# Patient Record
Sex: Male | Born: 1945 | Race: White | Hispanic: No | Marital: Single | Smoking: Current some day smoker
Health system: Southern US, Community
[De-identification: ages and names within clinical notes are randomized; demographics above are authoritative.]

## PROBLEM LIST (undated history)

## (undated) DIAGNOSIS — Z9889 Other specified postprocedural states: Secondary | ICD-10-CM

## (undated) DIAGNOSIS — E7800 Pure hypercholesterolemia, unspecified: Secondary | ICD-10-CM

## (undated) DIAGNOSIS — C801 Malignant (primary) neoplasm, unspecified: Secondary | ICD-10-CM

## (undated) DIAGNOSIS — M21371 Foot drop, right foot: Secondary | ICD-10-CM

## (undated) DIAGNOSIS — Z87442 Personal history of urinary calculi: Secondary | ICD-10-CM

## (undated) DIAGNOSIS — R112 Nausea with vomiting, unspecified: Secondary | ICD-10-CM

## (undated) DIAGNOSIS — G4730 Sleep apnea, unspecified: Secondary | ICD-10-CM

## (undated) DIAGNOSIS — M1990 Unspecified osteoarthritis, unspecified site: Secondary | ICD-10-CM

## (undated) HISTORY — PX: TONSILLECTOMY: SUR1361

## (undated) HISTORY — PX: KNEE ARTHROPLASTY: SHX992

## (undated) HISTORY — PX: LITHOTRIPSY: SUR834

## (undated) HISTORY — PX: CERVICAL SPINE SURGERY: SHX589

## (undated) HISTORY — PX: HERNIA REPAIR: SHX51

## (undated) HISTORY — PX: COLONOSCOPY: SHX174

## (undated) HISTORY — PX: SPINE SURGERY: SHX786

---

## 2015-12-01 HISTORY — PX: CARDIOVASCULAR STRESS TEST: SHX262

## 2018-10-13 ENCOUNTER — Other Ambulatory Visit (HOSPITAL_COMMUNITY): Payer: Self-pay | Admitting: Orthopedic Surgery

## 2018-10-13 DIAGNOSIS — Z96652 Presence of left artificial knee joint: Secondary | ICD-10-CM

## 2018-10-19 ENCOUNTER — Encounter (HOSPITAL_COMMUNITY)
Admission: RE | Admit: 2018-10-19 | Discharge: 2018-10-19 | Disposition: A | Discharge status: Home / Self Care | Payer: Medicare Other | Source: Ambulatory Visit | Attending: Orthopedic Surgery | Admitting: Orthopedic Surgery

## 2018-10-19 ENCOUNTER — Encounter (HOSPITAL_COMMUNITY): Payer: Self-pay

## 2018-10-19 DIAGNOSIS — Z96652 Presence of left artificial knee joint: Secondary | ICD-10-CM | POA: Insufficient documentation

## 2018-10-19 MED ORDER — TECHNETIUM TC 99M MEDRONATE IV KIT
20.0000 | PACK | Freq: Once | INTRAVENOUS | Status: AC | PRN
  Administered 2018-10-19: 20 via INTRAVENOUS

## 2018-12-26 NOTE — H&P (View-Only) (Signed)
TOTAL KNEE REVISION ADMISSION H&P  Patient is being admitted for left revision total knee arthroplasty.  Subjective:  Chief Complaint:  Left knee pain and stiffness s/p TKA  HPI: [name], 73 y.o. 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 The indications for the revision of the total knee arthroplasty are stiffness with pain. Onset of symptoms was gradual starting 1+ years ago with gradually worsening course since that time.  Prior procedures on the left knee include arthroplasty.  Patient currently rates pain in the left knee(s) at 5 out of 10 which is primarily after long periods of rest. There is night pain, pain that interferes with activities of daily living, pain with passive range of motion and joint swelling.  Patient has evidence of previous left TKA by imaging studies. This condition presents safety issues increasing the risk of falls.  There is no current active infection.  Risks, benefits and expectations were discussed with the patient.  Risks including but not limited to the risk of anesthesia, blood clots, nerve damage, blood vessel damage, failure of the prosthesis, infection and up to and including death.  Patient understand the risks, benefits and expectations and wishes to proceed with surgery.   PCP: Derrill Center., MD  D/C Plans:       Home   Post-op Meds:       No Rx given  Tranexamic Acid:      To be given - IV   Decadron:      Is to be given  FYI:      ASA  Norco  DME:    Rx given for - RW & 3-n-1  PT:    OPPT Rx given    Past Medical History:  Diagnosis Date  . Acquired right foot drop    numbness   . Allergies   . Arthritis   . Cancer ([place])    skin   . Chest pain    last occurred  a week ago; reports was at night, when pain occurs can last for hours, not accompanied by any other cards sx but does endorse new left jaw pain , saw dentist and was told it may carotid after reviewing dental/jaw images ; has had negative stress test in the past after having an abnormal EKG and dysrythmia  . Elevated cholesterol   . History of kidney stones   . Sleep apnea    i have a device but i dont really use it becasue i sleep okay without it   . ST elevation    on previous ekg      No current facility-administered medications for this encounter.    Current Outpatient Medications  Medication Sig Dispense Refill Last Dose  . acyclovir (ZOVIRAX) 400 MG tablet Take 400 mg by mouth 2 (two) times daily.     Marland Kitchen aspirin EC 81 MG tablet Take 81 mg by mouth daily.     Marland Kitchen loratadine (CLARITIN) 10 MG tablet Take 10 mg by mouth daily.     . Multiple Vitamins-Minerals (MULTIVITAMIN PO) Take 1 tablet by mouth daily.     . rosuvastatin (CRESTOR) 20 MG tablet Take 20 mg by mouth daily.     . sildenafil (REVATIO) 20 MG tablet Take 20 mg by mouth daily as needed (  for ED).      Allergies  Allergen Reactions  . Corticosteroids Other (See Comments)    Unknown  . Prednisone Other (See Comments)    Hiccups      Social History   Tobacco Use  . Smoking status: Current Some Day Smoker    Years: 6.00    Types: Cigarettes, Cigars  . Smokeless tobacco: Never Used  Substance Use Topics  . Alcohol use: Yes    Comment: 12-20 per week        Review of Systems  Constitutional: Negative.   HENT: Negative.   Eyes: Positive for blurred vision.  Respiratory: Negative.   Cardiovascular: Negative.        Irregular HR  Gastrointestinal: Negative.   Genitourinary: Negative.   Musculoskeletal: Positive for joint pain.  Skin: Negative.   Neurological: Positive for sensory change (numbness and drop foot in right LE).  Endo/Heme/Allergies: Negative.   Psychiatric/Behavioral: Negative.       Objective:  Physical Exam  Constitutional: He is oriented to person, place, and time. He appears well-developed.  HENT:  Head: Normocephalic.  Eyes: Pupils are equal, round, and reactive to light.  Neck: Neck supple. No JVD present. No tracheal deviation present. No thyromegaly present.  Cardiovascular: Normal rate, regular rhythm and intact distal pulses.  Respiratory: Effort normal and breath sounds normal. No respiratory distress. He has no wheezes.  GI: Soft. There is no abdominal tenderness. There is no guarding.  Musculoskeletal:     Left knee: He exhibits decreased range of motion and swelling. He exhibits no ecchymosis, no deformity, no laceration (healed previous incision) and no erythema. Tenderness found.  Lymphadenopathy:    He has no cervical adenopathy.  Neurological: He is alert and oriented to person, place, and time. A sensory deficit (right foot drop foot) is present.  Skin: Skin is warm and dry.  Psychiatric: He has a normal mood and affect.      Imaging Review Plain radiographs demonstrate severe degenerative joint disease of the left knee(s). The overall alignment is neutral. The bone quality appears to be good for age and reported activity level.    Preoperative templating of the joint replacement has been completed, documented, and submitted to the Operating Room personnel in order to optimize intra-operative equipment management.   Assessment/Plan:  Left knee with failed previous arthroplasty.   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 Revision total knee arthroplasty is deemed medically necessary. The treatment options including medical management, injection therapy, arthroscopy and revision arthroplasty were discussed at length. The risks and benefits of revision total knee arthroplasty were  presented and reviewed. The risks due to aseptic loosening, infection, stiffness, patella tracking problems, thromboembolic complications and other imponderables were discussed. The patient acknowledged the explanation, agreed to proceed with the plan and consent was signed. Patient is being admitted for inpatient treatment for surgery, pain control, PT, OT, prophylactic antibiotics, VTE prophylaxis, progressive ambulation and ADL's and discharge planning.The patient is planning to be discharged home.     West Pugh Brehanna Deveny   PA-C  01/10/2019, 8:07 AM

## 2018-12-26 NOTE — H&P (Signed)
TOTAL KNEE REVISION ADMISSION H&P  Patient is being admitted for left revision total knee arthroplasty.  Subjective:  Chief Complaint:  Left knee pain and stiffness s/p TKA  HPI: [name], 73 y.o. 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 The indications for the revision of the total knee arthroplasty are stiffness with pain. Onset of symptoms was gradual starting 1+ years ago with gradually worsening course since that time.  Prior procedures on the left knee include arthroplasty.  Patient currently rates pain in the left knee(s) at 5 out of 10 which is primarily after long periods of rest. There is night pain, pain that interferes with activities of daily living, pain with passive range of motion and joint swelling.  Patient has evidence of previous left TKA by imaging studies. This condition presents safety issues increasing the risk of falls.  There is no current active infection.  Risks, benefits and expectations were discussed with the patient.  Risks including but not limited to the risk of anesthesia, blood clots, nerve damage, blood vessel damage, failure of the prosthesis, infection and up to and including death.  Patient understand the risks, benefits and expectations and wishes to proceed with surgery.   PCP: Derrill Center., MD  D/C Plans:       Home   Post-op Meds:       No Rx given  Tranexamic Acid:      To be given - IV   Decadron:      Is to be given  FYI:      ASA  Norco  DME:    Rx given for - RW & 3-n-1  PT:    OPPT Rx given    Past Medical History:  Diagnosis Date  . Acquired right foot drop    numbness   . Allergies   . Arthritis   . Cancer ([place])    skin   . Chest pain    last occurred  a week ago; reports was at night, when pain occurs can last for hours, not accompanied by any other cards sx but does endorse new left jaw pain , saw dentist and was told it may carotid after reviewing dental/jaw images ; has had negative stress test in the past after having an abnormal EKG and dysrythmia  . Elevated cholesterol   . History of kidney stones   . Sleep apnea    i have a device but i dont really use it becasue i sleep okay without it   . ST elevation    on previous ekg      No current facility-administered medications for this encounter.    Current Outpatient Medications  Medication Sig Dispense Refill Last Dose  . acyclovir (ZOVIRAX) 400 MG tablet Take 400 mg by mouth 2 (two) times daily.     Marland Kitchen aspirin EC 81 MG tablet Take 81 mg by mouth daily.     Marland Kitchen loratadine (CLARITIN) 10 MG tablet Take 10 mg by mouth daily.     . Multiple Vitamins-Minerals (MULTIVITAMIN PO) Take 1 tablet by mouth daily.     . rosuvastatin (CRESTOR) 20 MG tablet Take 20 mg by mouth daily.     . sildenafil (REVATIO) 20 MG tablet Take 20 mg by mouth daily as needed (  for ED).      Allergies  Allergen Reactions  . Corticosteroids Other (See Comments)    Unknown  . Prednisone Other (See Comments)    Hiccups      Social History   Tobacco Use  . Smoking status: Current Some Day Smoker    Years: 6.00    Types: Cigarettes, Cigars  . Smokeless tobacco: Never Used  Substance Use Topics  . Alcohol use: Yes    Comment: 12-20 per week        Review of Systems  Constitutional: Negative.   HENT: Negative.   Eyes: Positive for blurred vision.  Respiratory: Negative.   Cardiovascular: Negative.        Irregular HR  Gastrointestinal: Negative.   Genitourinary: Negative.   Musculoskeletal: Positive for joint pain.  Skin: Negative.   Neurological: Positive for sensory change (numbness and drop foot in right LE).  Endo/Heme/Allergies: Negative.   Psychiatric/Behavioral: Negative.       Objective:  Physical Exam  Constitutional: He is oriented to person, place, and time. He appears well-developed.  HENT:  Head: Normocephalic.  Eyes: Pupils are equal, round, and reactive to light.  Neck: Neck supple. No JVD present. No tracheal deviation present. No thyromegaly present.  Cardiovascular: Normal rate, regular rhythm and intact distal pulses.  Respiratory: Effort normal and breath sounds normal. No respiratory distress. He has no wheezes.  GI: Soft. There is no abdominal tenderness. There is no guarding.  Musculoskeletal:     Left knee: He exhibits decreased range of motion and swelling. He exhibits no ecchymosis, no deformity, no laceration (healed previous incision) and no erythema. Tenderness found.  Lymphadenopathy:    He has no cervical adenopathy.  Neurological: He is alert and oriented to person, place, and time. A sensory deficit (right foot drop foot) is present.  Skin: Skin is warm and dry.  Psychiatric: He has a normal mood and affect.      Imaging Review Plain radiographs demonstrate severe degenerative joint disease of the left knee(s). The overall alignment is neutral. The bone quality appears to be good for age and reported activity level.    Preoperative templating of the joint replacement has been completed, documented, and submitted to the Operating Room personnel in order to optimize intra-operative equipment management.   Assessment/Plan:  Left knee with failed previous arthroplasty.   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 Revision total knee arthroplasty is deemed medically necessary. The treatment options including medical management, injection therapy, arthroscopy and revision arthroplasty were discussed at length. The risks and benefits of revision total knee arthroplasty were  presented and reviewed. The risks due to aseptic loosening, infection, stiffness, patella tracking problems, thromboembolic complications and other imponderables were discussed. The patient acknowledged the explanation, agreed to proceed with the plan and consent was signed. Patient is being admitted for inpatient treatment for surgery, pain control, PT, OT, prophylactic antibiotics, VTE prophylaxis, progressive ambulation and ADL's and discharge planning.The patient is planning to be discharged home.     West Pugh Deigo Alonso   PA-C  01/10/2019, 8:07 AM

## 2019-01-03 NOTE — Patient Instructions (Signed)
[name]  01/03/2019   Your procedure is scheduled on: 01-12-2019   Report to [facility] Main  Entrance     Report to admitting at 5:30AM    Call this number if you have problems the morning of surgery [phone]      Remember: Do not eat food or drink liquids :After Midnight. BRUSH YOUR TEETH MORNING OF SURGERY AND RINSE YOUR MOUTH OUT, NO CHEWING GUM CANDY OR MINTS.     Take these medicines the morning of surgery with A SIP OF WATER: ACYCLOVIR, CLARITIN, ROSUVASTATIN                                You may not have any metal on your body including hair pins and              piercings  Do not wear jewelry, make-up, lotions, powders or perfumes, deodorant                        Men may shave face and neck.   Do not bring valuables to the hospital. [place].  Contacts, dentures or bridgework may not be worn into surgery.  Leave suitcase in the car. After surgery it may be brought to your room.                   Please read over the following fact sheets you were given: _____________________________________________________________________             [facility] - Preparing for Surgery Before surgery, you can play an important role.  Because skin is not sterile, your skin needs to be as free of germs as possible.  You can reduce the number of germs on your skin by washing with CHG (chlorahexidine gluconate) soap before surgery.  CHG is an antiseptic cleaner which kills germs and bonds with the skin to continue killing germs even after washing. Please DO NOT use if you have an allergy to CHG or antibacterial soaps.  If your skin becomes reddened/irritated stop using the CHG and inform your nurse when you arrive at Short Stay. Do not shave (including legs and underarms) for at least 48 hours prior to the first CHG shower.  You may shave your face/neck. Please follow these instructions  carefully:  1.  Shower with CHG Soap the night before surgery and the  morning of Surgery.  2.  If you choose to wash your hair, wash your hair first as usual with your  normal  shampoo.  3.  After you shampoo, rinse your hair and body thoroughly to remove the  shampoo.                           4.  Use CHG as you would any other liquid soap.  You can apply chg directly  to the skin and wash                       Gently with a scrungie or clean washcloth.  5.  Apply the CHG Soap to your body ONLY FROM THE NECK DOWN.   Do not use on face/ open  Wound or open sores. Avoid contact with eyes, ears mouth and genitals (private parts).                       Wash face,  Genitals (private parts) with your normal soap.             6.  Wash thoroughly, paying special attention to the area where your surgery  will be performed.  7.  Thoroughly rinse your body with warm water from the neck down.  8.  DO NOT shower/wash with your normal soap after using and rinsing off  the CHG Soap.                9.  Pat yourself dry with a clean towel.            10.  Wear clean pajamas.            11.  Place clean sheets on your bed the night of your first shower and do not  sleep with pets. Day of Surgery : Do not apply any lotions/deodorants the morning of surgery.  Please wear clean clothes to the hospital/surgery center.  FAILURE TO FOLLOW THESE INSTRUCTIONS MAY RESULT IN THE CANCELLATION OF YOUR SURGERY PATIENT SIGNATURE_________________________________  NURSE SIGNATURE__________________________________  ________________________________________________________________________   [name]  An incentive spirometer is a tool that can help keep your lungs clear and active. This tool measures how well you are filling your lungs with each breath. Taking long deep breaths may help reverse or decrease the chance of developing breathing (pulmonary) problems (especially infection)  following:  A long period of time when you are unable to move or be active. BEFORE THE PROCEDURE   If the spirometer includes an indicator to show your best effort, your nurse or respiratory therapist will set it to a desired goal.  If possible, sit up straight or lean slightly forward. Try not to slouch.  Hold the incentive spirometer in an upright position. INSTRUCTIONS FOR USE  1. Sit on the edge of your bed if possible, or sit up as far as you can in bed or on a chair. 2. Hold the incentive spirometer in an upright position. 3. Breathe out normally. 4. Place the mouthpiece in your mouth and seal your lips tightly around it. 5. Breathe in slowly and as deeply as possible, raising the piston or the ball toward the top of the column. 6. Hold your breath for 3-5 seconds or for as long as possible. Allow the piston or ball to fall to the bottom of the column. 7. Remove the mouthpiece from your mouth and breathe out normally. 8. Rest for a few seconds and repeat Steps 1 through 7 at least 10 times every 1-2 hours when you are awake. Take your time and take a few normal breaths between deep breaths. 9. The spirometer may include an indicator to show your best effort. Use the indicator as a goal to work toward during each repetition. 10. After each set of 10 deep breaths, practice coughing to be sure your lungs are clear. If you have an incision (the cut made at the time of surgery), support your incision when coughing by placing a pillow or rolled up towels firmly against it. Once you are able to get out of bed, walk around indoors and cough well. You may stop using the incentive spirometer when instructed by your caregiver.  RISKS AND COMPLICATIONS  Take your time so you do not get  dizzy or light-headed.  If you are in pain, you may need to take or ask for pain medication before doing incentive spirometry. It is harder to take a deep breath if you are having pain. AFTER USE  Rest and  breathe slowly and easily.  It can be helpful to keep track of a log of your progress. Your caregiver can provide you with a simple table to help with this. If you are using the spirometer at home, follow these instructions: [place] IF:   You are having difficultly using the spirometer.  You have trouble using the spirometer as often as instructed.  Your pain medication is not giving enough relief while using the spirometer.  You develop fever of 100.5 F (38.1 C) or higher. SEEK IMMEDIATE MEDICAL CARE IF:   You cough up bloody sputum that had not been present before.  You develop fever of 102 F (38.9 C) or greater.  You develop worsening pain at or near the incision site. MAKE SURE YOU:   Understand these instructions.  Will watch your condition.  Will get help right away if you are not doing well or get worse. Document Released: 03/29/2007 Document Revised: 02/08/2012 Document Reviewed: 05/30/2007 Exit[employer] Patient Information 2014 Exit[employer], [place].   ________________________________________________________________________  WHAT IS A BLOOD TRANSFUSION? Blood Transfusion Information  A transfusion is the replacement of blood or some of its parts. Blood is made up of multiple cells which provide different functions.  Red blood cells carry oxygen and are used for blood loss replacement.  White blood cells fight against infection.  Platelets control bleeding.  Plasma helps clot blood.  Other blood products are available for specialized needs, such as hemophilia or other clotting disorders. BEFORE THE TRANSFUSION  Who gives blood for transfusions?   Healthy volunteers who are fully evaluated to make sure their blood is safe. This is blood bank blood. Transfusion therapy is the safest it has ever been in the practice of medicine. Before blood is taken from a donor, a complete history is taken to make sure that person has no history of diseases nor engages in  risky social behavior (examples are intravenous drug use or sexual activity with multiple partners). The donor's travel history is screened to minimize risk of transmitting infections, such as malaria. The donated blood is tested for signs of infectious diseases, such as HIV and hepatitis. The blood is then tested to be sure it is compatible with you in order to minimize the chance of a transfusion reaction. If you or a relative donates blood, this is often done in anticipation of surgery and is not appropriate for emergency situations. It takes many days to process the donated blood. RISKS AND COMPLICATIONS Although transfusion therapy is very safe and saves many lives, the main dangers of transfusion include:   Getting an infectious disease.  Developing a transfusion reaction. This is an allergic reaction to something in the blood you were given. Every precaution is taken to prevent this. The decision to have a blood transfusion has been considered carefully by your caregiver before blood is given. Blood is not given unless the benefits outweigh the risks. AFTER THE TRANSFUSION  Right after receiving a blood transfusion, you will usually feel much better and more energetic. This is especially true if your red blood cells have gotten low (anemic). The transfusion raises the level of the red blood cells which carry oxygen, and this usually causes an energy increase.  The nurse administering the transfusion will  monitor you carefully for complications. HOME CARE INSTRUCTIONS  No special instructions are needed after a transfusion. You may find your energy is better. Speak with your caregiver about any limitations on activity for underlying diseases you may have. SEEK MEDICAL CARE IF:   Your condition is not improving after your transfusion.  You develop redness or irritation at the intravenous (IV) site. SEEK IMMEDIATE MEDICAL CARE IF:  Any of the following symptoms occur over the next 12  hours:  Shaking chills.  You have a temperature by mouth above 102 F (38.9 C), not controlled by medicine.  Chest, back, or muscle pain.  People around you feel you are not acting correctly or are confused.  Shortness of breath or difficulty breathing.  Dizziness and fainting.  You get a rash or develop hives.  You have a decrease in urine output.  Your urine turns a dark color or changes to pink, red, or brown. Any of the following symptoms occur over the next 10 days:  You have a temperature by mouth above 102 F (38.9 C), not controlled by medicine.  Shortness of breath.  Weakness after normal activity.  The white part of the eye turns yellow (jaundice).  You have a decrease in the amount of urine or are urinating less often.  Your urine turns a dark color or changes to pink, red, or brown. Document Released: 11/13/2000 Document Revised: 02/08/2012 Document Reviewed: 07/02/2008 [facility] Patient Information 2014 [place], [place].  _______________________________________________________________________

## 2019-01-03 NOTE — Progress Notes (Signed)
SURGICAL CLEARANCE . DR Jenny Reichmann WEAVER ON CHART  STRESS/ ECHO 2017 Epic CARE EVERYWHERE

## 2019-01-05 ENCOUNTER — Other Ambulatory Visit: Payer: Self-pay

## 2019-01-05 ENCOUNTER — Encounter (HOSPITAL_COMMUNITY): Payer: Self-pay | Admitting: Physician Assistant

## 2019-01-05 ENCOUNTER — Encounter (HOSPITAL_COMMUNITY): Payer: Self-pay

## 2019-01-05 ENCOUNTER — Encounter (HOSPITAL_COMMUNITY)
Admission: RE | Admit: 2019-01-05 | Discharge: 2019-01-05 | Disposition: A | Discharge status: Home / Self Care | Payer: Medicare Other | Source: Ambulatory Visit | Attending: Orthopedic Surgery | Admitting: Orthopedic Surgery

## 2019-01-05 DIAGNOSIS — Z01818 Encounter for other preprocedural examination: Secondary | ICD-10-CM | POA: Diagnosis not present

## 2019-01-05 DIAGNOSIS — I213 ST elevation (STEMI) myocardial infarction of unspecified site: Secondary | ICD-10-CM | POA: Insufficient documentation

## 2019-01-05 HISTORY — DX: Unspecified osteoarthritis, unspecified site: M19.90

## 2019-01-05 HISTORY — DX: Pure hypercholesterolemia, unspecified: E78.00

## 2019-01-05 HISTORY — DX: Malignant (primary) neoplasm, unspecified: C80.1

## 2019-01-05 HISTORY — DX: Personal history of urinary calculi: Z87.442

## 2019-01-05 HISTORY — DX: Foot drop, right foot: M21.371

## 2019-01-05 HISTORY — DX: Sleep apnea, unspecified: G47.30

## 2019-01-05 LAB — BASIC METABOLIC PANEL
Anion gap: 8 (ref 5–15)
BUN: 15 mg/dL (ref 8–23)
CO2: 25 mmol/L (ref 22–32)
Calcium: 8.7 mg/dL — ABNORMAL LOW (ref 8.9–10.3)
Chloride: 105 mmol/L (ref 98–111)
Creatinine, Ser: 0.92 mg/dL (ref 0.61–1.24)
GFR calc Af Amer: >60 mL/min (ref >60)
GFR calc non Af Amer: >60 mL/min (ref >60)
Glucose, Bld: 94 mg/dL (ref 70–99)
Potassium: 4.1 mmol/L (ref 3.5–5.1)
Sodium: 138 mmol/L (ref 135–145)

## 2019-01-05 LAB — CBC
HCT: 45.1 % (ref 39.0–52.0)
Hemoglobin: 14.8 g/dL (ref 13.0–17.0)
MCH: 30.6 pg (ref 26.0–34.0)
MCHC: 32.8 g/dL (ref 30.0–36.0)
MCV: 93.2 fL (ref 80.0–100.0)
NRBC: 0 % (ref 0.0–0.2)
Platelets: 181 10*3/uL (ref 150–400)
RBC: 4.84 MIL/uL (ref 4.22–5.81)
RDW: 12 % (ref 11.5–15.5)
WBC: 4.3 10*3/uL (ref 4.0–10.5)

## 2019-01-05 LAB — SURGICAL PCR SCREEN
MRSA, PCR: NEGATIVE
STAPHYLOCOCCUS AUREUS: NEGATIVE

## 2019-01-05 LAB — ABO/RH: ABO/RH(D): A POS

## 2019-01-05 NOTE — Progress Notes (Signed)
Patient reports intermittent chest pain and jaw pain. RN made [place] PA aware. EKG obtained at pre-op, per Janett Billow , surgeons office has been contacted to begin process of obtaining cardiac clearance .

## 2019-01-06 ENCOUNTER — Inpatient Hospital Stay (HOSPITAL_COMMUNITY): Admission: RE | Admit: 2019-01-06 | Payer: Medicare Other | Source: Ambulatory Visit

## 2019-01-10 NOTE — Progress Notes (Signed)
Cardiology Office Note   Date:  01/11/2019   ID:  [name], DOB [birth year], MRN [id number]  PCP:  Derrill Center., MD  Cardiologist:   No primary care provider on file. Referring:  [name], MD  Chief Complaint  Patient presents with  . Chest Pain      History of Present Illness: [name] is a 73 y.o. male who is referred by [name], MD for preop evaluation.   He has had chest pain.  He also has an abnormal EKG.  He did have a stress perfusion study at [facility] in the past.  I was able to review this result and this was negative for any evidence of ischemia but a well-preserved ejection fraction in 2017.  He is getting ready to have a redo left knee surgery.  Has had surgery on this a couple of times but has to have the replacement replacement.  During the preop evaluation he described some chest discomfort and some neck discomfort.  Chest discomfort was last week.  Happen at rest.  He was admitted sharp discomfort lasting for about an hour.  It was 7 out of 10 in intensity.  It did not radiate.  There was no jaw or arm pain at that moment.  There was no associated symptoms.  He had no nausea vomiting or diaphoresis.  He also had some jaw pain.  This happened about 3 to 4 weeks ago and again in December.  It was up his posterior left neck.  It was tight discomfort.  There again was no associated nausea vomiting or diaphoresis.  He has not had any shortness of breath, PND or orthopnea.  He has some palpitations but has had these for years.  He is never had any presyncope or syncope.  He exercises almost every day doing some aggressive aerobic activities.  Of note he does mention scanning in the past demonstrating aortic atherosclerosis told that he had some carotid plaque over is never had Doppler to quantify this.   Past Medical History:  Diagnosis Date  . Acquired right foot drop    numbness   . Arthritis   . Cancer ([place])    skin   . Elevated cholesterol   .  History of kidney stones   . Sleep apnea    Does not use CPAP   PSH: Tonsillectomy Cervical spine surgery Lumbar spine surgery Lithotripsy Inguinal hernia repair Total knee replacement   Current Outpatient Medications  Medication Sig Dispense Refill  . acyclovir (ZOVIRAX) 400 MG tablet Take 400 mg by mouth 2 (two) times daily.    Marland Kitchen loratadine (CLARITIN) 10 MG tablet Take 10 mg by mouth daily.    . Multiple Vitamins-Minerals (MULTIVITAMIN PO) Take 1 tablet by mouth daily.    . rosuvastatin (CRESTOR) 20 MG tablet Take 20 mg by mouth daily.    . sildenafil (REVATIO) 20 MG tablet Take 20 mg by mouth daily as needed (for ED).    Marland Kitchen aspirin EC 81 MG tablet Take 81 mg by mouth daily.     No current facility-administered medications for this visit.     Allergies:   Corticosteroids and Prednisone    Social History:  The patient  reports that he has been smoking cigars. He has smoked for the past 6.00 years. He has never used smokeless tobacco. He reports current alcohol use.   Family History:  The patient's family history includes Alkaptonuria in his sister; Barb Merino in his sister; Dementia in  his father; Heart attack in his paternal grandfather; Parkinson's disease in his father.    ROS:  Please see the history of present illness.   Otherwise, review of systems are positive for none.   All other systems are reviewed and negative.    PHYSICAL EXAM: VS:  BP 124/70   Pulse 80   Ht 5\' 10"  (1.778 m)   Wt 183 lb (83 kg)   BMI 26.26 kg/m  , BMI Body mass index is 26.26 kg/m. GENERAL:  Well appearing HEENT:  Pupils equal round and reactive, fundi not visualized, oral mucosa unremarkable NECK:  No jugular venous distention, waveform within normal limits, carotid upstroke brisk and symmetric, no bruits, no thyromegaly LYMPHATICS:  No cervical, inguinal adenopathy LUNGS:  Clear to auscultation bilaterally BACK:  No CVA tenderness CHEST:  Unremarkable HEART:  PMI not displaced or  sustained,S1 and S2 within normal limits, no S3, no S4, no clicks, no rubs, no murmurs ABD:  Flat, positive bowel sounds normal in frequency in pitch, no bruits, no rebound, no guarding, no midline pulsatile mass, no hepatomegaly, no splenomegaly EXT:  2 plus pulses throughout, no edema, no cyanosis no clubbing SKIN:  No rashes no nodules NEURO:  Cranial nerves II through XII grossly intact, motor grossly intact throughout PSYCH:  Cognitively intact, oriented to person place and time    EKG:  EKG is not ordered today. The ekg ordered 01/05/19 demonstrates sinus rhythm, rate 77, axis within normal limits, intervals within normal limits, no acute ST-T wave changes, premature ectopic complexes.   Recent Labs: 01/05/2019: BUN 15; Creatinine, Ser 0.92; Hemoglobin 14.8; Platelets 181; Potassium 4.1; Sodium 138    Lipid Panel No results found for: CHOL, TRIG, HDL, CHOLHDL, VLDL, LDLCALC, LDLDIRECT    Wt Readings from Last 3 Encounters:  01/11/19 183 lb (83 kg)  01/05/19 179 lb (81.2 kg)      Other studies Reviewed: Additional studies/ records that were reviewed today include: EKG, Lexiscan Myoview.. Review of the above records demonstrates:  Please see elsewhere in the note.     ASSESSMENT AND PLAN:  PREOP: The patient did have some chest discomfort.  This will be evaluated as below.  CHEST PAIN:  I am going to bring him back for a POET (Plain Old Exercise Treadmill).  If this is negative as I suspect it will be he will be acceptable risk for planned surgery.  DYSLIPIDEMIA: Given the evidence of plaque mentioned above I agree with a goal LDL of 70 or lower.  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  No change in therapy.  CAROTID PLAQUE: He has had this noted in the past and will now follow-up with carotid Dopplers.   Current medicines are reviewed at length with the patient today.  The patient does not have concerns regarding medicines.  The following  changes have been made:  None  Labs/ tests ordered today include:   Orders Placed This Encounter  Procedures  . EXERCISE TOLERANCE TEST (ETT)     Disposition:   FU with me as needed.     Signed, Minus Breeding, MD  01/11/2019 12:27 PM    [facility]

## 2019-01-11 ENCOUNTER — Encounter: Payer: Self-pay | Admitting: Cardiology

## 2019-01-11 ENCOUNTER — Telehealth (HOSPITAL_COMMUNITY): Payer: Self-pay | Admitting: *Deleted

## 2019-01-11 ENCOUNTER — Ambulatory Visit (INDEPENDENT_AMBULATORY_CARE_PROVIDER_SITE_OTHER): Payer: Medicare Other | Admitting: Cardiology

## 2019-01-11 VITALS — BP 124/70 | HR 80 | Ht 70.0 in | Wt 183.0 lb

## 2019-01-11 DIAGNOSIS — R079 Chest pain, unspecified: Secondary | ICD-10-CM | POA: Diagnosis not present

## 2019-01-11 DIAGNOSIS — I6523 Occlusion and stenosis of bilateral carotid arteries: Secondary | ICD-10-CM | POA: Insufficient documentation

## 2019-01-11 DIAGNOSIS — I700 Atherosclerosis of aorta: Secondary | ICD-10-CM

## 2019-01-11 DIAGNOSIS — E785 Hyperlipidemia, unspecified: Secondary | ICD-10-CM

## 2019-01-11 DIAGNOSIS — I493 Ventricular premature depolarization: Secondary | ICD-10-CM | POA: Diagnosis not present

## 2019-01-11 DIAGNOSIS — I6529 Occlusion and stenosis of unspecified carotid artery: Secondary | ICD-10-CM | POA: Insufficient documentation

## 2019-01-11 NOTE — Patient Instructions (Addendum)
Medication Instructions:  Continue current medications  If you need a refill on your cardiac medications before your next appointment, please call your pharmacy.  Labwork: None Ordered   Take the provided lab slips with you to the lab for your blood draw.   When you have your labs (blood work) drawn today and your tests are completely normal, you will receive your results only by MyChart Message (if you have MyChart) -OR-  A paper copy in the mail.  If you have any lab test that is abnormal or we need to change your treatment, we will call you to review these results.  Testing/Procedures: Your physician has requested that you have an exercise tolerance test. For further information please visit [email or web address]. Please also follow instruction sheet, as given.   Follow-Up: . Your physician recommends that you schedule a follow-up appointment in: As Needed   At [facility], you and your health needs are our priority.  As part of our continuing mission to provide you with exceptional heart care, we have created designated Provider Care Teams.  These Care Teams include your primary Cardiologist (physician) and Advanced Practice Providers (APPs -  Physician Assistants and Nurse Practitioners) who all work together to provide you with the care you need, when you need it.   Thank you for choosing [facility] at [facility]!!

## 2019-01-11 NOTE — Telephone Encounter (Signed)
Close encounter 

## 2019-01-12 ENCOUNTER — Ambulatory Visit (HOSPITAL_COMMUNITY)
Admission: RE | Admit: 2019-01-12 | Discharge: 2019-01-12 | Disposition: A | Discharge status: Home / Self Care | Payer: Medicare Other | Source: Ambulatory Visit | Attending: Cardiology | Admitting: Cardiology

## 2019-01-12 ENCOUNTER — Encounter (HOSPITAL_COMMUNITY): Admission: RE | Payer: Self-pay | Source: Home / Self Care

## 2019-01-12 ENCOUNTER — Inpatient Hospital Stay (HOSPITAL_COMMUNITY): Admission: RE | Admit: 2019-01-12 | Payer: Medicare Other | Source: Home / Self Care | Admitting: Orthopedic Surgery

## 2019-01-12 DIAGNOSIS — R079 Chest pain, unspecified: Secondary | ICD-10-CM | POA: Insufficient documentation

## 2019-01-12 LAB — EXERCISE TOLERANCE TEST
CSEPHR: 91 %
Estimated workload: 11.4 METS
Exercise duration (min): 9 min
Exercise duration (sec): 48 s
MPHR: 149 {beats}/min
Peak HR: 136 {beats}/min
RPE: 17
Rest HR: 76 {beats}/min

## 2019-01-12 LAB — TYPE AND SCREEN
ABO/RH(D): A POS
Antibody Screen: NEGATIVE

## 2019-01-12 SURGERY — TOTAL KNEE REVISION
Anesthesia: Spinal | Laterality: Left

## 2019-01-13 ENCOUNTER — Ambulatory Visit (HOSPITAL_COMMUNITY)
Admission: RE | Admit: 2019-01-13 | Discharge: 2019-01-13 | Disposition: A | Discharge status: Home / Self Care | Payer: Medicare Other | Source: Ambulatory Visit | Attending: Cardiology | Admitting: Cardiology

## 2019-01-13 DIAGNOSIS — I6529 Occlusion and stenosis of unspecified carotid artery: Secondary | ICD-10-CM | POA: Insufficient documentation

## 2019-01-17 ENCOUNTER — Telehealth: Payer: Self-pay | Admitting: *Deleted

## 2019-01-17 ENCOUNTER — Encounter: Payer: Self-pay | Admitting: Cardiology

## 2019-01-17 NOTE — Telephone Encounter (Signed)
Patient notified of carotid ans ETT results. Acceptable risk for surgery to be done by Dr Alvan Dame.

## 2019-01-17 NOTE — Telephone Encounter (Signed)
-----   Message from Minus Breeding, MD sent at 01/13/2019  9:25 PM EST ----- No evidence of plaque.  No further work up.  Call Mr. Crosley with the results and send results to Derrill Center., MD

## 2019-01-18 NOTE — Progress Notes (Signed)
Please place orders in Epic as patient is being scheduled for a pre-op appointment for his surgery on 01/30/2019! Thank you!

## 2019-01-23 NOTE — Patient Instructions (Addendum)
[name]  01/23/2019   Your procedure is scheduled on: 01/30/2019   Report to [facility] Main  Entrance  Report to admitting 1000 AM    Call this number if you have problems the morning of surgery [phone]   Remember: Do not eat food or drink liquids :After Midnight. BRUSH YOUR TEETH MORNING OF SURGERY AND RINSE YOUR MOUTH OUT, NO CHEWING GUM CANDY OR MINTS.     Take these medicines the morning of surgery with A SIP OF WATER: none                                 You may not have any metal on your body including hair pins and              piercings  Do not wear jewelry, , lotions, powders or perfumes, deodorant                          Men may shave face and neck.   Do not bring valuables to the hospital. [place].  Contacts, dentures or bridgework may not be worn into surgery.  Leave suitcase in the car. After surgery it may be brought to your room.                   Please read over the following fact sheets you were given: _____________________________________________________________________             [facility] - Preparing for Surgery Before surgery, you can play an important role.  Because skin is not sterile, your skin needs to be as free of germs as possible.  You can reduce the number of germs on your skin by washing with CHG (chlorahexidine gluconate) soap before surgery.  CHG is an antiseptic cleaner which kills germs and bonds with the skin to continue killing germs even after washing. Please DO NOT use if you have an allergy to CHG or antibacterial soaps.  If your skin becomes reddened/irritated stop using the CHG and inform your nurse when you arrive at Short Stay. Do not shave (including legs and underarms) for at least 48 hours prior to the first CHG shower.  You may shave your face/neck. Please follow these instructions carefully:  1.  Shower with CHG Soap the night before  surgery and the  morning of Surgery.  2.  If you choose to wash your hair, wash your hair first as usual with your  normal  shampoo.  3.  After you shampoo, rinse your hair and body thoroughly to remove the  shampoo.                           4.  Use CHG as you would any other liquid soap.  You can apply chg directly  to the skin and wash                       Gently with a scrungie or clean washcloth.  5.  Apply the CHG Soap to your body ONLY FROM THE NECK DOWN.   Do not use on face/ open  Wound or open sores. Avoid contact with eyes, ears mouth and genitals (private parts).                       Wash face,  Genitals (private parts) with your normal soap.             6.  Wash thoroughly, paying special attention to the area where your surgery  will be performed.  7.  Thoroughly rinse your body with warm water from the neck down.  8.  DO NOT shower/wash with your normal soap after using and rinsing off  the CHG Soap.                9.  Pat yourself dry with a clean towel.            10.  Wear clean pajamas.            11.  Place clean sheets on your bed the night of your first shower and do not  sleep with pets. Day of Surgery : Do not apply any lotions/deodorants the morning of surgery.  Please wear clean clothes to the hospital/surgery center.  FAILURE TO FOLLOW THESE INSTRUCTIONS MAY RESULT IN THE CANCELLATION OF YOUR SURGERY PATIENT SIGNATURE_________________________________  NURSE SIGNATURE__________________________________  ________________________________________________________________________  WHAT IS A BLOOD TRANSFUSION? Blood Transfusion Information  A transfusion is the replacement of blood or some of its parts. Blood is made up of multiple cells which provide different functions.  Red blood cells carry oxygen and are used for blood loss replacement.  White blood cells fight against infection.  Platelets control bleeding.  Plasma helps clot  blood.  Other blood products are available for specialized needs, such as hemophilia or other clotting disorders. BEFORE THE TRANSFUSION  Who gives blood for transfusions?   Healthy volunteers who are fully evaluated to make sure their blood is safe. This is blood bank blood. Transfusion therapy is the safest it has ever been in the practice of medicine. Before blood is taken from a donor, a complete history is taken to make sure that person has no history of diseases nor engages in risky social behavior (examples are intravenous drug use or sexual activity with multiple partners). The donor's travel history is screened to minimize risk of transmitting infections, such as malaria. The donated blood is tested for signs of infectious diseases, such as HIV and hepatitis. The blood is then tested to be sure it is compatible with you in order to minimize the chance of a transfusion reaction. If you or a relative donates blood, this is often done in anticipation of surgery and is not appropriate for emergency situations. It takes many days to process the donated blood. RISKS AND COMPLICATIONS Although transfusion therapy is very safe and saves many lives, the main dangers of transfusion include:   Getting an infectious disease.  Developing a transfusion reaction. This is an allergic reaction to something in the blood you were given. Every precaution is taken to prevent this. The decision to have a blood transfusion has been considered carefully by your caregiver before blood is given. Blood is not given unless the benefits outweigh the risks. AFTER THE TRANSFUSION  Right after receiving a blood transfusion, you will usually feel much better and more energetic. This is especially true if your red blood cells have gotten low (anemic). The transfusion raises the level of the red blood cells which carry oxygen, and this usually causes an energy increase.  The  nurse administering the transfusion will monitor  you carefully for complications. HOME CARE INSTRUCTIONS  No special instructions are needed after a transfusion. You may find your energy is better. Speak with your caregiver about any limitations on activity for underlying diseases you may have. SEEK MEDICAL CARE IF:   Your condition is not improving after your transfusion.  You develop redness or irritation at the intravenous (IV) site. SEEK IMMEDIATE MEDICAL CARE IF:  Any of the following symptoms occur over the next 12 hours:  Shaking chills.  You have a temperature by mouth above 102 F (38.9 C), not controlled by medicine.  Chest, back, or muscle pain.  People around you feel you are not acting correctly or are confused.  Shortness of breath or difficulty breathing.  Dizziness and fainting.  You get a rash or develop hives.  You have a decrease in urine output.  Your urine turns a dark color or changes to pink, red, or brown. Any of the following symptoms occur over the next 10 days:  You have a temperature by mouth above 102 F (38.9 C), not controlled by medicine.  Shortness of breath.  Weakness after normal activity.  The white part of the eye turns yellow (jaundice).  You have a decrease in the amount of urine or are urinating less often.  Your urine turns a dark color or changes to pink, red, or brown. Document Released: 11/13/2000 Document Revised: 02/08/2012 Document Reviewed: 07/02/2008 ExitCare Patient Information 2014 [place].  _______________________________________________________________________  Incentive Spirometer  An incentive spirometer is a tool that can help keep your lungs clear and active. This tool measures how well you are filling your lungs with each breath. Taking long deep breaths may help reverse or decrease the chance of developing breathing (pulmonary) problems (especially infection) following:  A long period of time when you are unable to move or be active. BEFORE THE  PROCEDURE   If the spirometer includes an indicator to show your best effort, your nurse or respiratory therapist will set it to a desired goal.  If possible, sit up straight or lean slightly forward. Try not to slouch.  Hold the incentive spirometer in an upright position. INSTRUCTIONS FOR USE  1. Sit on the edge of your bed if possible, or sit up as far as you can in bed or on a chair. 2. Hold the incentive spirometer in an upright position. 3. Breathe out normally. 4. Place the mouthpiece in your mouth and seal your lips tightly around it. 5. Breathe in slowly and as deeply as possible, raising the piston or the ball toward the top of the column. 6. Hold your breath for 3-5 seconds or for as long as possible. Allow the piston or ball to fall to the bottom of the column. 7. Remove the mouthpiece from your mouth and breathe out normally. 8. Rest for a few seconds and repeat Steps 1 through 7 at least 10 times every 1-2 hours when you are awake. Take your time and take a few normal breaths between deep breaths. 9. The spirometer may include an indicator to show your best effort. Use the indicator as a goal to work toward during each repetition. 10. After each set of 10 deep breaths, practice coughing to be sure your lungs are clear. If you have an incision (the cut made at the time of surgery), support your incision when coughing by placing a pillow or rolled up towels firmly against it. Once you are able to get  out of bed, walk around indoors and cough well. You may stop using the incentive spirometer when instructed by your caregiver.  RISKS AND COMPLICATIONS  Take your time so you do not get dizzy or light-headed.  If you are in pain, you may need to take or ask for pain medication before doing incentive spirometry. It is harder to take a deep breath if you are having pain. AFTER USE  Rest and breathe slowly and easily.  It can be helpful to keep track of a log of your progress. Your  caregiver can provide you with a simple table to help with this. If you are using the spirometer at home, follow these instructions: [place] IF:   You are having difficultly using the spirometer.  You have trouble using the spirometer as often as instructed.  Your pain medication is not giving enough relief while using the spirometer.  You develop fever of 100.5 F (38.1 C) or higher. SEEK IMMEDIATE MEDICAL CARE IF:   You cough up bloody sputum that had not been present before.  You develop fever of 102 F (38.9 C) or greater.  You develop worsening pain at or near the incision site. MAKE SURE YOU:   Understand these instructions.  Will watch your condition.  Will get help right away if you are not doing well or get worse. Document Released: 03/29/2007 Document Revised: 02/08/2012 Document Reviewed: 05/30/2007 [employer] Patient Information 2014 [place], [place].   ________________________________________________________________________

## 2019-01-26 ENCOUNTER — Encounter (HOSPITAL_COMMUNITY): Payer: Self-pay

## 2019-01-26 ENCOUNTER — Encounter (HOSPITAL_COMMUNITY)
Admission: RE | Admit: 2019-01-26 | Discharge: 2019-01-26 | Disposition: A | Discharge status: Home / Self Care | Payer: Medicare Other | Source: Ambulatory Visit | Attending: Orthopedic Surgery | Admitting: Orthopedic Surgery

## 2019-01-26 ENCOUNTER — Other Ambulatory Visit: Payer: Self-pay

## 2019-01-26 DIAGNOSIS — Z01812 Encounter for preprocedural laboratory examination: Secondary | ICD-10-CM | POA: Diagnosis not present

## 2019-01-26 DIAGNOSIS — T85848A Pain due to other internal prosthetic devices, implants and grafts, initial encounter: Secondary | ICD-10-CM | POA: Diagnosis not present

## 2019-01-26 DIAGNOSIS — Z96652 Presence of left artificial knee joint: Secondary | ICD-10-CM | POA: Diagnosis not present

## 2019-01-26 HISTORY — DX: Other specified postprocedural states: R11.2

## 2019-01-26 HISTORY — DX: Other specified postprocedural states: Z98.890

## 2019-01-26 LAB — CBC
HCT: 45.5 % (ref 39.0–52.0)
Hemoglobin: 15.2 g/dL (ref 13.0–17.0)
MCH: 31.5 pg (ref 26.0–34.0)
MCHC: 33.4 g/dL (ref 30.0–36.0)
MCV: 94.4 fL (ref 80.0–100.0)
PLATELETS: 186 10*3/uL (ref 150–400)
RBC: 4.82 MIL/uL (ref 4.22–5.81)
RDW: 12.2 % (ref 11.5–15.5)
WBC: 4.8 10*3/uL (ref 4.0–10.5)
nRBC: 0 % (ref 0.0–0.2)

## 2019-01-26 LAB — BASIC METABOLIC PANEL
Anion gap: 8 (ref 5–15)
BUN: 17 mg/dL (ref 8–23)
CALCIUM: 8.9 mg/dL (ref 8.9–10.3)
CO2: 25 mmol/L (ref 22–32)
CREATININE: 0.85 mg/dL (ref 0.61–1.24)
Chloride: 106 mmol/L (ref 98–111)
GFR calc Af Amer: >60 mL/min (ref >60)
GFR calc non Af Amer: >60 mL/min (ref >60)
Glucose, Bld: 98 mg/dL (ref 70–99)
Potassium: 4.3 mmol/L (ref 3.5–5.1)
Sodium: 139 mmol/L (ref 135–145)

## 2019-01-26 LAB — SURGICAL PCR SCREEN
MRSA, PCR: NEGATIVE
STAPHYLOCOCCUS AUREUS: NEGATIVE

## 2019-01-26 NOTE — Progress Notes (Addendum)
EKG-01/05/19-epic  01/17/19- Clearance- DR Hochrein- epic  Stress Test 01/12/19-epic  Carotid- 01/13/19-epic  LOV- Dr Percival Spanish 01/11/19-epic  Surgery was cancelled on 01/11/2019 due to patient reported chest pain.  Sent to Dr Starleen Blue for workup.

## 2019-01-27 NOTE — Anesthesia Preprocedure Evaluation (Addendum)
Anesthesia Evaluation  Patient identified by MRN, date of birth, ID band Patient awake    Reviewed: Allergy & Precautions, NPO status , Patient's Chart, lab work & pertinent test results  History of Anesthesia Complications (+) PONV  Airway Mallampati: II  TM Distance: >3 FB Neck ROM: Full    Dental no notable dental hx. (+) Teeth Intact   Pulmonary sleep apnea , Current Smoker,    Pulmonary exam normal breath sounds clear to auscultation       Cardiovascular Exercise Tolerance: Good negative cardio ROS Normal cardiovascular exam Rhythm:Regular Rate:Normal  01/12/2019 Neg ETT 01/05/2019 EKG- SR R 77 PVCs   Neuro/Psych negative neurological ROS  negative psych ROS   GI/Hepatic negative GI ROS, Neg liver ROS,   Endo/Other  negative endocrine ROS  Renal/GU negative Renal ROSCr 0.85     Musculoskeletal  (+) Arthritis ,   Abdominal   Peds  Hematology Hgb 15.2 Plts 186   Anesthesia Other Findings   Reproductive/Obstetrics                           Anesthesia Physical Anesthesia Plan  ASA: II  Anesthesia Plan: Spinal   Post-op Pain Management:  Regional for Post-op pain   Induction:   PONV Risk Score and Plan: Treatment may vary due to age or medical condition, Ondansetron and Dexamethasone  Airway Management Planned: Nasal Cannula and Natural Airway  Additional Equipment:   Intra-op Plan:   Post-operative Plan:   Informed Consent: I have reviewed the patients History and Physical, chart, labs and discussed the procedure including the risks, benefits and alternatives for the proposed anesthesia with the patient or authorized representative who has indicated his/her understanding and acceptance.     Dental advisory given  Plan Discussed with:   Anesthesia Plan Comments: (See PAT note 01/26/19, Konrad Felix, PA-C  L TKRevision under Spinal w Adductor Canal)       Anesthesia Quick Evaluation

## 2019-01-27 NOTE — Progress Notes (Signed)
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 [name].   Surgery previously cancelled 01/11/19 due to pt complaining of chest pain.  Seen by cardiology, Dr. Minus Breeding, on 01/11/19.  Exercise test and carotid doppler ordered with negative results.  Test results states, "Acceptable risk for surgery to be done by Dr. Alvan Dame."  Pt can proceed with planned procedure barring acute status change.  VS: BP 139/80   Pulse 73   Temp 36.6 C (Oral)   Resp 16   Ht 5\' 10"  (1.778 m)   Wt 81.3 kg   SpO2 97%   BMI 25.71 kg/m   PROVIDERS: Derrill Center., MD is PCP   Minus Breeding, MD is Cardiologist  LABS: Labs reviewed: Acceptable for surgery. (all labs ordered are listed, but only abnormal results are displayed)  Labs Reviewed  SURGICAL PCR SCREEN  BASIC METABOLIC PANEL  CBC  TYPE AND SCREEN     IMAGES: Carotid Doppler 01/13/19 Summary: Right Carotid: The extracranial vessels were near-normal with only minimal wall                thickening or plaque. Left Carotid: There was no evidence of thrombus, dissection, atherosclerotic               plaque or stenosis in the cervical carotid system. Vertebrals:  Bilateral vertebral arteries demonstrate antegrade flow. Subclavians: Normal flow hemodynamics were seen in bilateral subclavian              arteries.  EKG: 01/05/2019 Rate 77 bpm Sinus rhythm with occasional Premature ventricular complexes Otherwise normal ECG  CV: Stress Test 01/12/19  There was no ST segment deviation noted during stress.  Occasional PVCs noted during stress.  9 minutes  and 48 seconds of exercise, good effort. Normal blood pressure response.  Overall low risk exercise treadmill test with no electrocardiographic evidence of ischemia. Past Medical History:  Diagnosis Date  . Acquired right foot drop    numbness   . Arthritis   . Cancer ([place])    skin   . Elevated cholesterol   . History of kidney stones   . PONV (postoperative nausea and vomiting)   . Sleep apnea    Does not use CPAP    Past Surgical History:  Procedure Laterality Date  . CARDIOVASCULAR STRESS TEST  2017   negative per patient   . CERVICAL SPINE SURGERY    . COLONOSCOPY    . HERNIA REPAIR     inguinal   . KNEE ARTHROPLASTY Left april 2018, december 2019  . LITHOTRIPSY     multiple   . SPINE SURGERY     Lumbar  . TONSILLECTOMY     uvula sleep apnea surgery     MEDICATIONS: . acyclovir (ZOVIRAX) 400 MG tablet  . aspirin EC 81 MG tablet  . loratadine (CLARITIN) 10 MG tablet  . Multiple Vitamins-Minerals (MULTIVITAMIN PO)  . rosuvastatin (CRESTOR) 20 MG tablet  . sildenafil (REVATIO) 20 MG tablet   No current facility-administered medications for  this encounter.     Maia [facility] Pre-Surgical Testing [phone] 01/27/19 2:47 PM

## 2019-01-30 ENCOUNTER — Other Ambulatory Visit: Payer: Self-pay

## 2019-01-30 ENCOUNTER — Inpatient Hospital Stay (HOSPITAL_COMMUNITY): Payer: Medicare Other | Admitting: Anesthesiology

## 2019-01-30 ENCOUNTER — Encounter (HOSPITAL_COMMUNITY)
Admission: RE | Disposition: A | Discharge status: Home Health | Payer: Self-pay | Source: Home / Self Care | Attending: Orthopedic Surgery

## 2019-01-30 ENCOUNTER — Encounter (HOSPITAL_COMMUNITY): Payer: Self-pay | Admitting: Emergency Medicine

## 2019-01-30 ENCOUNTER — Inpatient Hospital Stay (HOSPITAL_COMMUNITY)
Admission: RE | Admit: 2019-01-30 | Discharge: 2019-01-31 | DRG: 468 | Disposition: A | Discharge status: Home Health | Payer: Medicare Other | Attending: Orthopedic Surgery | Admitting: Orthopedic Surgery

## 2019-01-30 ENCOUNTER — Inpatient Hospital Stay (HOSPITAL_COMMUNITY): Payer: Medicare Other | Admitting: Physician Assistant

## 2019-01-30 DIAGNOSIS — Z79899 Other long term (current) drug therapy: Secondary | ICD-10-CM | POA: Diagnosis not present

## 2019-01-30 DIAGNOSIS — Z96652 Presence of left artificial knee joint: Secondary | ICD-10-CM

## 2019-01-30 DIAGNOSIS — F17210 Nicotine dependence, cigarettes, uncomplicated: Secondary | ICD-10-CM | POA: Diagnosis present

## 2019-01-30 DIAGNOSIS — Z7982 Long term (current) use of aspirin: Secondary | ICD-10-CM

## 2019-01-30 DIAGNOSIS — Y792 Prosthetic and other implants, materials and accessory orthopedic devices associated with adverse incidents: Secondary | ICD-10-CM | POA: Diagnosis present

## 2019-01-30 DIAGNOSIS — M21371 Foot drop, right foot: Secondary | ICD-10-CM | POA: Diagnosis present

## 2019-01-30 DIAGNOSIS — M25562 Pain in left knee: Secondary | ICD-10-CM | POA: Diagnosis present

## 2019-01-30 DIAGNOSIS — E7800 Pure hypercholesterolemia, unspecified: Secondary | ICD-10-CM | POA: Diagnosis present

## 2019-01-30 DIAGNOSIS — M1990 Unspecified osteoarthritis, unspecified site: Secondary | ICD-10-CM | POA: Diagnosis present

## 2019-01-30 DIAGNOSIS — Z888 Allergy status to other drugs, medicaments and biological substances status: Secondary | ICD-10-CM

## 2019-01-30 DIAGNOSIS — G4730 Sleep apnea, unspecified: Secondary | ICD-10-CM | POA: Diagnosis present

## 2019-01-30 DIAGNOSIS — M1712 Unilateral primary osteoarthritis, left knee: Secondary | ICD-10-CM | POA: Diagnosis present

## 2019-01-30 DIAGNOSIS — T8484XA Pain due to internal orthopedic prosthetic devices, implants and grafts, initial encounter: Secondary | ICD-10-CM | POA: Diagnosis present

## 2019-01-30 DIAGNOSIS — Z87442 Personal history of urinary calculi: Secondary | ICD-10-CM | POA: Diagnosis not present

## 2019-01-30 DIAGNOSIS — T84093A Other mechanical complication of internal left knee prosthesis, initial encounter: Principal | ICD-10-CM | POA: Diagnosis present

## 2019-01-30 HISTORY — PX: TOTAL KNEE REVISION: SHX996

## 2019-01-30 LAB — TYPE AND SCREEN
ABO/RH(D): A POS
Antibody Screen: NEGATIVE

## 2019-01-30 SURGERY — TOTAL KNEE REVISION
Anesthesia: Spinal | Site: Knee | Laterality: Left

## 2019-01-30 MED ORDER — KETOROLAC TROMETHAMINE 30 MG/ML IJ SOLN
INTRAMUSCULAR | Status: DC | PRN
  Administered 2019-01-30: 30 mg

## 2019-01-30 MED ORDER — HYDROCODONE-ACETAMINOPHEN 5-325 MG PO TABS
1.0000 | ORAL_TABLET | ORAL | Status: DC | PRN
  Administered 2019-01-30 – 2019-01-31 (×4): 1 via ORAL
  Filled 2019-01-30: qty 1
  Filled 2019-01-30: qty 2
  Filled 2019-01-30 (×2): qty 1

## 2019-01-30 MED ORDER — HYDROMORPHONE HCL 1 MG/ML IJ SOLN: 0.2500 mg | INTRAMUSCULAR | Status: DC | PRN

## 2019-01-30 MED ORDER — KETOROLAC TROMETHAMINE 30 MG/ML IJ SOLN
INTRAMUSCULAR | Status: AC
  Filled 2019-01-30: qty 1

## 2019-01-30 MED ORDER — HYDROMORPHONE HCL 1 MG/ML IJ SOLN: 0.5000 mg | INTRAMUSCULAR | Status: DC | PRN

## 2019-01-30 MED ORDER — LORATADINE 10 MG PO TABS
10.0000 mg | ORAL_TABLET | Freq: Every day | ORAL | Status: DC
  Administered 2019-01-31: 10 mg via ORAL
  Filled 2019-01-30: qty 1

## 2019-01-30 MED ORDER — TRANEXAMIC ACID-NACL 1000-0.7 MG/100ML-% IV SOLN
1000.0000 mg | INTRAVENOUS | Status: AC
  Administered 2019-01-30: 1000 mg via INTRAVENOUS
  Filled 2019-01-30: qty 100

## 2019-01-30 MED ORDER — MAGNESIUM CITRATE PO SOLN: 1.0000 | Freq: Once | ORAL | Status: DC | PRN

## 2019-01-30 MED ORDER — CHLORHEXIDINE GLUCONATE 4 % EX LIQD: 60.0000 mL | Freq: Once | CUTANEOUS | Status: DC

## 2019-01-30 MED ORDER — ACETAMINOPHEN 325 MG PO TABS: 325.0000 mg | ORAL_TABLET | Freq: Four times a day (QID) | ORAL | Status: DC | PRN

## 2019-01-30 MED ORDER — BUPIVACAINE HCL (PF) 0.75 % IJ SOLN
INTRAMUSCULAR | Status: DC | PRN
  Administered 2019-01-30: 2 mL via INTRATHECAL

## 2019-01-30 MED ORDER — DOCUSATE SODIUM 100 MG PO CAPS: 100.0000 mg | ORAL_CAPSULE | Freq: Two times a day (BID) | ORAL | 0 refills | Status: AC

## 2019-01-30 MED ORDER — ONDANSETRON HCL 4 MG/2ML IJ SOLN: 4.0000 mg | Freq: Four times a day (QID) | INTRAMUSCULAR | Status: DC | PRN

## 2019-01-30 MED ORDER — SODIUM CHLORIDE (PF) 0.9 % IJ SOLN
INTRAMUSCULAR | Status: AC
  Filled 2019-01-30: qty 10

## 2019-01-30 MED ORDER — ONDANSETRON HCL 4 MG/2ML IJ SOLN
INTRAMUSCULAR | Status: DC | PRN
  Administered 2019-01-30: 4 mg via INTRAVENOUS

## 2019-01-30 MED ORDER — DIPHENHYDRAMINE HCL 12.5 MG/5ML PO ELIX: 12.5000 mg | ORAL_SOLUTION | ORAL | Status: DC | PRN

## 2019-01-30 MED ORDER — ONDANSETRON HCL 4 MG PO TABS: 4.0000 mg | ORAL_TABLET | Freq: Four times a day (QID) | ORAL | Status: DC | PRN

## 2019-01-30 MED ORDER — PROPOFOL 10 MG/ML IV BOLUS
INTRAVENOUS | Status: AC
  Filled 2019-01-30: qty 80

## 2019-01-30 MED ORDER — MIDAZOLAM HCL 2 MG/2ML IJ SOLN
1.0000 mg | INTRAMUSCULAR | Status: DC
  Filled 2019-01-30: qty 2

## 2019-01-30 MED ORDER — CELECOXIB 200 MG PO CAPS
200.0000 mg | ORAL_CAPSULE | Freq: Two times a day (BID) | ORAL | Status: DC
  Administered 2019-01-30 – 2019-01-31 (×2): 200 mg via ORAL
  Filled 2019-01-30 (×2): qty 1

## 2019-01-30 MED ORDER — ASPIRIN 81 MG PO CHEW: 81.0000 mg | CHEWABLE_TABLET | Freq: Two times a day (BID) | ORAL | 0 refills | Status: AC

## 2019-01-30 MED ORDER — METOCLOPRAMIDE HCL 5 MG/ML IJ SOLN: 5.0000 mg | Freq: Three times a day (TID) | INTRAMUSCULAR | Status: DC | PRN

## 2019-01-30 MED ORDER — DOCUSATE SODIUM 100 MG PO CAPS
100.0000 mg | ORAL_CAPSULE | Freq: Two times a day (BID) | ORAL | Status: DC
  Administered 2019-01-30 – 2019-01-31 (×2): 100 mg via ORAL
  Filled 2019-01-30 (×2): qty 1

## 2019-01-30 MED ORDER — 0.9 % SODIUM CHLORIDE (POUR BTL) OPTIME
TOPICAL | Status: DC | PRN
  Administered 2019-01-30: 1000 mL

## 2019-01-30 MED ORDER — PROPOFOL 500 MG/50ML IV EMUL
INTRAVENOUS | Status: DC | PRN
  Administered 2019-01-30: 75 ug/kg/min via INTRAVENOUS

## 2019-01-30 MED ORDER — ACETAMINOPHEN 10 MG/ML IV SOLN: 1000.0000 mg | Freq: Once | INTRAVENOUS | Status: DC | PRN

## 2019-01-30 MED ORDER — PHENOL 1.4 % MT LIQD: 1.0000 | OROMUCOSAL | Status: DC | PRN

## 2019-01-30 MED ORDER — DEXAMETHASONE SODIUM PHOSPHATE 10 MG/ML IJ SOLN
10.0000 mg | Freq: Once | INTRAMUSCULAR | Status: AC
  Administered 2019-01-30: 10 mg via INTRAVENOUS

## 2019-01-30 MED ORDER — METHOCARBAMOL 500 MG IVPB - SIMPLE MED
500.0000 mg | Freq: Four times a day (QID) | INTRAVENOUS | Status: DC | PRN
  Filled 2019-01-30: qty 50

## 2019-01-30 MED ORDER — METHOCARBAMOL 500 MG PO TABS
500.0000 mg | ORAL_TABLET | Freq: Four times a day (QID) | ORAL | Status: DC | PRN
  Administered 2019-01-30 – 2019-01-31 (×2): 500 mg via ORAL
  Filled 2019-01-30 (×3): qty 1

## 2019-01-30 MED ORDER — FENTANYL CITRATE (PF) 100 MCG/2ML IJ SOLN
50.0000 ug | INTRAMUSCULAR | Status: DC
  Administered 2019-01-30: 50 ug via INTRAVENOUS
  Filled 2019-01-30: qty 2

## 2019-01-30 MED ORDER — ASPIRIN 81 MG PO CHEW
81.0000 mg | CHEWABLE_TABLET | Freq: Two times a day (BID) | ORAL | Status: DC
  Administered 2019-01-30 – 2019-01-31 (×2): 81 mg via ORAL
  Filled 2019-01-30 (×2): qty 1

## 2019-01-30 MED ORDER — LACTATED RINGERS IV SOLN
INTRAVENOUS | Status: DC
  Administered 2019-01-30 (×2): via INTRAVENOUS

## 2019-01-30 MED ORDER — STERILE WATER FOR IRRIGATION IR SOLN
Status: DC | PRN
  Administered 2019-01-30: 2000 mL

## 2019-01-30 MED ORDER — ROSUVASTATIN CALCIUM 20 MG PO TABS
20.0000 mg | ORAL_TABLET | Freq: Every day | ORAL | Status: DC
  Administered 2019-01-31: 20 mg via ORAL
  Filled 2019-01-30: qty 1

## 2019-01-30 MED ORDER — PROPOFOL 500 MG/50ML IV EMUL
INTRAVENOUS | Status: DC | PRN
  Administered 2019-01-30: 30 mg via INTRAVENOUS

## 2019-01-30 MED ORDER — ALUM & MAG HYDROXIDE-SIMETH 200-200-20 MG/5ML PO SUSP: 15.0000 mL | ORAL | Status: DC | PRN

## 2019-01-30 MED ORDER — HYDROCODONE-ACETAMINOPHEN 7.5-325 MG PO TABS: 1.0000 | ORAL_TABLET | Freq: Once | ORAL | Status: DC | PRN

## 2019-01-30 MED ORDER — FERROUS SULFATE 325 (65 FE) MG PO TABS
325.0000 mg | ORAL_TABLET | Freq: Two times a day (BID) | ORAL | Status: DC
  Administered 2019-01-30 – 2019-01-31 (×2): 325 mg via ORAL
  Filled 2019-01-30 (×2): qty 1

## 2019-01-30 MED ORDER — CLONIDINE HCL (ANALGESIA) 100 MCG/ML EP SOLN
EPIDURAL | Status: DC | PRN
  Administered 2019-01-30: 50 ug

## 2019-01-30 MED ORDER — METHOCARBAMOL 500 MG PO TABS: 500.0000 mg | ORAL_TABLET | Freq: Four times a day (QID) | ORAL | 0 refills | Status: AC | PRN

## 2019-01-30 MED ORDER — BUPIVACAINE HCL (PF) 0.25 % IJ SOLN
INTRAMUSCULAR | Status: AC
  Filled 2019-01-30: qty 30

## 2019-01-30 MED ORDER — SODIUM CHLORIDE 0.9 % IR SOLN
Status: DC | PRN
  Administered 2019-01-30: 4000 mL

## 2019-01-30 MED ORDER — FERROUS SULFATE 325 (65 FE) MG PO TABS: 325.0000 mg | ORAL_TABLET | Freq: Three times a day (TID) | ORAL | 3 refills | Status: AC

## 2019-01-30 MED ORDER — BISACODYL 10 MG RE SUPP: 10.0000 mg | Freq: Every day | RECTAL | Status: DC | PRN

## 2019-01-30 MED ORDER — SODIUM CHLORIDE (PF) 0.9 % IJ SOLN
INTRAMUSCULAR | Status: DC | PRN
  Administered 2019-01-30: 10 mL

## 2019-01-30 MED ORDER — ONDANSETRON HCL 4 MG/2ML IJ SOLN: 4.0000 mg | Freq: Once | INTRAMUSCULAR | Status: DC | PRN

## 2019-01-30 MED ORDER — PROPOFOL 10 MG/ML IV BOLUS
INTRAVENOUS | Status: AC
  Filled 2019-01-30: qty 20

## 2019-01-30 MED ORDER — ROPIVACAINE HCL 5 MG/ML IJ SOLN
INTRAMUSCULAR | Status: DC | PRN
  Administered 2019-01-30: 30 mL

## 2019-01-30 MED ORDER — PROPOFOL 10 MG/ML IV BOLUS
INTRAVENOUS | Status: AC
  Filled 2019-01-30: qty 60

## 2019-01-30 MED ORDER — HYDROCODONE-ACETAMINOPHEN 7.5-325 MG PO TABS: 1.0000 | ORAL_TABLET | ORAL | Status: DC | PRN

## 2019-01-30 MED ORDER — POLYETHYLENE GLYCOL 3350 17 G PO PACK
17.0000 g | PACK | Freq: Two times a day (BID) | ORAL | Status: DC
  Administered 2019-01-30 – 2019-01-31 (×2): 17 g via ORAL
  Filled 2019-01-30 (×2): qty 1

## 2019-01-30 MED ORDER — SODIUM CHLORIDE 0.9 % IV SOLN
INTRAVENOUS | Status: DC
  Administered 2019-01-30: 17:00:00 via INTRAVENOUS

## 2019-01-30 MED ORDER — HYDROCODONE-ACETAMINOPHEN 7.5-325 MG PO TABS: 1.0000 | ORAL_TABLET | ORAL | 0 refills | Status: AC | PRN

## 2019-01-30 MED ORDER — CEFAZOLIN SODIUM-DEXTROSE 2-4 GM/100ML-% IV SOLN
2.0000 g | INTRAVENOUS | Status: AC
  Administered 2019-01-30: 2 g via INTRAVENOUS
  Filled 2019-01-30: qty 100

## 2019-01-30 MED ORDER — EPHEDRINE SULFATE 50 MG/ML IJ SOLN
INTRAMUSCULAR | Status: DC | PRN
  Administered 2019-01-30 (×2): 10 mg via INTRAVENOUS

## 2019-01-30 MED ORDER — ACYCLOVIR 400 MG PO TABS
400.0000 mg | ORAL_TABLET | Freq: Two times a day (BID) | ORAL | Status: DC
  Administered 2019-01-30 – 2019-01-31 (×2): 400 mg via ORAL
  Filled 2019-01-30 (×2): qty 1

## 2019-01-30 MED ORDER — TRANEXAMIC ACID-NACL 1000-0.7 MG/100ML-% IV SOLN
1000.0000 mg | Freq: Once | INTRAVENOUS | Status: AC
  Administered 2019-01-30: 1000 mg via INTRAVENOUS
  Filled 2019-01-30: qty 100

## 2019-01-30 MED ORDER — CEFAZOLIN SODIUM-DEXTROSE 2-4 GM/100ML-% IV SOLN
2.0000 g | Freq: Four times a day (QID) | INTRAVENOUS | Status: AC
  Administered 2019-01-30 – 2019-01-31 (×2): 2 g via INTRAVENOUS
  Filled 2019-01-30 (×2): qty 100

## 2019-01-30 MED ORDER — EPHEDRINE 5 MG/ML INJ
INTRAVENOUS | Status: AC
  Filled 2019-01-30: qty 10

## 2019-01-30 MED ORDER — POLYETHYLENE GLYCOL 3350 17 G PO PACK: 17.0000 g | PACK | Freq: Two times a day (BID) | ORAL | 0 refills | Status: AC

## 2019-01-30 MED ORDER — METOCLOPRAMIDE HCL 5 MG PO TABS: 5.0000 mg | ORAL_TABLET | Freq: Three times a day (TID) | ORAL | Status: DC | PRN

## 2019-01-30 MED ORDER — BUPIVACAINE HCL (PF) 0.25 % IJ SOLN
INTRAMUSCULAR | Status: DC | PRN
  Administered 2019-01-30: 30 mL

## 2019-01-30 MED ORDER — MENTHOL 3 MG MT LOZG: 1.0000 | LOZENGE | OROMUCOSAL | Status: DC | PRN

## 2019-01-30 SURGICAL SUPPLY — 73 items
ADAPTER BOLT FEMORAL +2/-2 (Knees) ×3 IMPLANT
AUGMENT FMRL POST PFC 4MM SZ5 (Orthopedic Implant) ×1 IMPLANT
AUGMENT POST 5 8 (Orthopedic Implant) ×2 IMPLANT
AUGMENT POST 5 8MM (Orthopedic Implant) ×1 IMPLANT
BAG DECANTER FOR FLEXI CONT (MISCELLANEOUS) IMPLANT
BAG ZIPLOCK 12X15 (MISCELLANEOUS) IMPLANT
BANDAGE ACE 6X5 VEL STRL LF (GAUZE/BANDAGES/DRESSINGS) ×3 IMPLANT
BANDAGE ELASTIC 6 VELCRO ST LF (GAUZE/BANDAGES/DRESSINGS) ×3 IMPLANT
BLADE SAW SGTL 11.0X1.19X90.0M (BLADE) ×3 IMPLANT
BLADE SAW SGTL 13.0X1.19X90.0M (BLADE) ×3 IMPLANT
BLADE SAW SGTL 81X20 HD (BLADE) ×3 IMPLANT
BLADE SURG SZ10 CARB STEEL (BLADE) ×6 IMPLANT
BRUSH FEMORAL CANAL (MISCELLANEOUS) ×3 IMPLANT
CEMENT HV SMART SET (Cement) ×9 IMPLANT
CEMENT RESTRICTOR DEPUY SZ 6 (Cement) ×3 IMPLANT
COVER SURGICAL LIGHT HANDLE (MISCELLANEOUS) ×3 IMPLANT
COVER WAND RF STERILE (DRAPES) IMPLANT
CUFF TOURN SGL QUICK 34 (TOURNIQUET CUFF) ×2
CUFF TRNQT CYL 34X4.125X (TOURNIQUET CUFF) ×1 IMPLANT
DECANTER SPIKE VIAL GLASS SM (MISCELLANEOUS) ×6 IMPLANT
DERMABOND ADVANCED (GAUZE/BANDAGES/DRESSINGS) ×2
DERMABOND ADVANCED .7 DNX12 (GAUZE/BANDAGES/DRESSINGS) ×1 IMPLANT
DRAPE U-SHAPE 47X51 STRL (DRAPES) ×3 IMPLANT
DRESSING AQUACEL AG SP 3.5X10 (GAUZE/BANDAGES/DRESSINGS) ×1 IMPLANT
DRSG AQUACEL AG ADV 3.5X10 (GAUZE/BANDAGES/DRESSINGS) ×3 IMPLANT
DRSG AQUACEL AG SP 3.5X10 (GAUZE/BANDAGES/DRESSINGS) ×3
DURAPREP 26ML APPLICATOR (WOUND CARE) ×6 IMPLANT
ELECT REM PT RETURN 15FT ADLT (MISCELLANEOUS) ×3 IMPLANT
FEM POST AUG PFC 4MM SZ5 (Orthopedic Implant) ×3 IMPLANT
FEMORAL ADAPTER (Orthopedic Implant) ×3 IMPLANT
FEMUR SIGMA PS SZ 5.0 L (Femur) ×3 IMPLANT
GAUZE SPONGE 2X2 8PLY STRL LF (GAUZE/BANDAGES/DRESSINGS) IMPLANT
GLOVE BIOGEL PI IND STRL 7.5 (GLOVE) ×1 IMPLANT
GLOVE BIOGEL PI IND STRL 8.5 (GLOVE) ×1 IMPLANT
GLOVE BIOGEL PI INDICATOR 7.5 (GLOVE) ×2
GLOVE BIOGEL PI INDICATOR 8.5 (GLOVE) ×2
GLOVE ECLIPSE 8.0 STRL XLNG CF (GLOVE) ×6 IMPLANT
GLOVE ORTHO TXT STRL SZ7.5 (GLOVE) ×3 IMPLANT
GOWN STRL REUS W/TWL 2XL LVL3 (GOWN DISPOSABLE) ×3 IMPLANT
GOWN STRL REUS W/TWL LRG LVL3 (GOWN DISPOSABLE) ×3 IMPLANT
GOWN STRL REUS W/TWL XL LVL3 (GOWN DISPOSABLE) IMPLANT
HANDPIECE INTERPULSE COAX TIP (DISPOSABLE) ×2
HOLDER FOLEY CATH W/STRAP (MISCELLANEOUS) ×3 IMPLANT
MANIFOLD NEPTUNE II (INSTRUMENTS) ×3 IMPLANT
NDL SAFETY ECLIPSE 18X1.5 (NEEDLE) ×1 IMPLANT
NEEDLE HYPO 18GX1.5 SHARP (NEEDLE) ×2
NS IRRIG 1000ML POUR BTL (IV SOLUTION) ×3 IMPLANT
PACK TOTAL KNEE CUSTOM (KITS) ×3 IMPLANT
PATELLA DOME PFC 41MM (Knees) ×3 IMPLANT
PIN FIX SIGMA HP QUICK REL (PIN) ×3 IMPLANT
PLATE ROT INSERT 10MM SIZE 5 (Plate) ×3 IMPLANT
PROTECTOR NERVE ULNAR (MISCELLANEOUS) ×3 IMPLANT
RESTRICTOR CEMENT SZ 5 C-STEM (Cement) ×3 IMPLANT
SET HNDPC FAN SPRY TIP SCT (DISPOSABLE) ×1 IMPLANT
SET PAD KNEE POSITIONER (MISCELLANEOUS) ×3 IMPLANT
SPONGE GAUZE 2X2 STER 10/PKG (GAUZE/BANDAGES/DRESSINGS)
STAPLER VISISTAT 35W (STAPLE) IMPLANT
STEM TIBIA PFC 13X30MM (Stem) ×6 IMPLANT
SUT MNCRL AB 3-0 PS2 18 (SUTURE) ×3 IMPLANT
SUT STRATAFIX PDS+ 0 24IN (SUTURE) ×3 IMPLANT
SUT VIC AB 1 CT1 36 (SUTURE) ×6 IMPLANT
SUT VIC AB 2-0 CT1 27 (SUTURE) ×6
SUT VIC AB 2-0 CT1 TAPERPNT 27 (SUTURE) ×3 IMPLANT
SYR 50ML LL SCALE MARK (SYRINGE) IMPLANT
TOWER CARTRIDGE SMART MIX (DISPOSABLE) ×3 IMPLANT
TRAY FOLEY MTR SLVR 16FR STAT (SET/KITS/TRAYS/PACK) ×3 IMPLANT
TRAY REVISION SZ 4 (Knees) ×3 IMPLANT
TRAY SLEEVE CEM ML (Knees) ×3 IMPLANT
TUBE KAMVAC SUCTION (TUBING) IMPLANT
WATER STERILE IRR 1000ML POUR (IV SOLUTION) ×3 IMPLANT
WEDGE SIZE 4 5MM (Knees) ×6 IMPLANT
WRAP KNEE MAXI GEL POST OP (GAUZE/BANDAGES/DRESSINGS) ×3 IMPLANT
YANKAUER SUCT BULB TIP 10FT TU (MISCELLANEOUS) ×3 IMPLANT

## 2019-01-30 NOTE — Anesthesia Procedure Notes (Signed)
Spinal  Start time: 01/30/2019 1:20 PM End time: 01/30/2019 1:23 PM Staffing Resident/CRNA: Gean Maidens, CRNA Performed: resident/CRNA  Preanesthetic Checklist Completed: patient identified, site marked, surgical consent, pre-op evaluation, timeout performed, IV checked, risks and benefits discussed and monitors and equipment checked Spinal Block Patient position: sitting Prep: DuraPrep Patient monitoring: heart rate, continuous pulse ox and blood pressure Approach: midline Location: L4-5 Injection technique: single-shot Needle Needle type: Pencan  Needle length: 9 cm Needle insertion depth: 8 cm Additional Notes Pt sitting position sterile prep and drape negative paresthesia/heme

## 2019-01-30 NOTE — Discharge Instructions (Signed)

## 2019-01-30 NOTE — Anesthesia Procedure Notes (Signed)
Anesthesia Regional Block: Adductor canal block   Pre-Anesthetic Checklist: ,, timeout performed, Correct Patient, Correct Site, Correct Laterality, Correct Procedure, Correct Position, site marked, Risks and benefits discussed,  Surgical consent,  Pre-op evaluation,  At surgeon's request and post-op pain management  Laterality: Lower and Left  Prep: chloraprep       Needles:  Injection technique: Single-shot  Needle Type: Echogenic Needle     Needle Length: 9cm  Needle Gauge: 22     Additional Needles:   Procedures:,,,, ultrasound used (permanent image in chart),,,,  Narrative:  Start time: 01/30/2019 12:02 PM End time: 01/30/2019 12:08 PM Injection made incrementally with aspirations every 5 mL.  Performed by: Personally  Anesthesiologist: Barnet Glasgow, MD  Additional Notes: Block assessed prior to surgery. Pt tolerated procedure well.

## 2019-01-30 NOTE — Brief Op Note (Signed)
01/30/2019  4:44 PM  PATIENT:  [name]  73 y.o. 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  PROCEDURE:  Procedure(s) with comments: TOTAL KNEE REVISION (Left) - 2 hrs  SURGEON:  Surgeon(s) and Role:    [name], MD - Primary  PHYSICIAN ASSISTANT: Griffith Citron, PA-C  ANESTHESIA:   regional and spinal  EBL:  <200cc   BLOOD ADMINISTERED:none  DRAINS: none   LOCAL MEDICATIONS USED:  MARCAINE     SPECIMEN:  No Specimen  DISPOSITION OF SPECIMEN:  N/A  COUNTS:  YES  TOURNIQUET:   Total Tourniquet Time Documented: Thigh (Left) - 73 minutes Total: Thigh (Left) - 73 minutes   DICTATION: .Other Dictation: Dictation Number [id number]  PLAN OF CARE: Admit to inpatient   PATIENT DISPOSITION:  PACU - hemodynamically stable.   Delay start of Pharmacological VTE agent (>24hrs) due to surgical blood loss or risk of bleeding: no

## 2019-01-30 NOTE — Progress Notes (Signed)
AssistedDr. Houser with left, ultrasound guided, adductor canal block. Side rails up, monitors on throughout procedure. See vital signs in flow sheet. Tolerated Procedure well.  

## 2019-01-30 NOTE — Interval H&P Note (Signed)
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 After consideration of risks, benefits and other options for treatment, the patient has consented to  Procedure(s) with comments: TOTAL KNEE REVISION (Left) - 2 hrs as a surgical intervention .  The patient's history has been reviewed, patient examined, no change in status, stable for surgery.  I have reviewed the patient's chart and labs.  Questions were answered to the patient's satisfaction.     Mauri Pole

## 2019-01-30 NOTE — Transfer of Care (Signed)
Immediate Anesthesia Transfer of Care Note  Patient: [name]  Procedure(s) Performed: TOTAL KNEE REVISION (Left Knee)  Patient Location: PACU  Anesthesia Type:Spinal  Level of Consciousness: sedated, patient cooperative and responds to stimulation  Airway & Oxygen Therapy: Patient Spontanous Breathing and Patient connected to face mask oxygen  Post-op Assessment: Report given to RN and Post -op Vital signs reviewed and stable  Post vital signs: Reviewed and stable  Last Vitals:  Vitals Value Taken Time  BP 109/71 01/30/2019  4:05 PM  Temp    Pulse 67 01/30/2019  4:06 PM  Resp 11 01/30/2019  4:06 PM  SpO2 100 % 01/30/2019  4:06 PM  Vitals shown include unvalidated device data.  Last Pain:  Vitals:   01/30/19 1230  TempSrc:   PainSc: 0-No pain      Patients Stated Pain Goal: 4 (12/75/17 0017)  Complications: No apparent anesthesia complications

## 2019-01-31 LAB — CBC
HEMATOCRIT: 36 % — AB (ref 39.0–52.0)
Hemoglobin: 11.4 g/dL — ABNORMAL LOW (ref 13.0–17.0)
MCH: 30.7 pg (ref 26.0–34.0)
MCHC: 31.7 g/dL (ref 30.0–36.0)
MCV: 97 fL (ref 80.0–100.0)
Platelets: 144 10*3/uL — ABNORMAL LOW (ref 150–400)
RBC: 3.71 MIL/uL — ABNORMAL LOW (ref 4.22–5.81)
RDW: 12.2 % (ref 11.5–15.5)
WBC: 9.8 10*3/uL (ref 4.0–10.5)
nRBC: 0 % (ref 0.0–0.2)

## 2019-01-31 LAB — BASIC METABOLIC PANEL
ANION GAP: 6 (ref 5–15)
BUN: 12 mg/dL (ref 8–23)
CO2: 24 mmol/L (ref 22–32)
Calcium: 8.1 mg/dL — ABNORMAL LOW (ref 8.9–10.3)
Chloride: 108 mmol/L (ref 98–111)
Creatinine, Ser: 0.78 mg/dL (ref 0.61–1.24)
GFR calc Af Amer: >60 mL/min (ref >60)
GFR calc non Af Amer: >60 mL/min (ref >60)
Glucose, Bld: 121 mg/dL — ABNORMAL HIGH (ref 70–99)
Potassium: 4.1 mmol/L (ref 3.5–5.1)
Sodium: 138 mmol/L (ref 135–145)

## 2019-01-31 NOTE — Progress Notes (Signed)
     Subjective: 1 Day Post-Op Procedure(s) (LRB): TOTAL KNEE REVISION (Left)   Patient reports pain as mild, pain controlled.  No events throughout the night.  Dr. Alvan Dame discussed the procedure and findings.  Also discussed the importance of PT post-op and obtaining ROM and preventing scar tissue.  Ready to be discharged home.    Objective:   VITALS:   Vitals:   01/31/19 0119 01/31/19 0634  BP: 107/64 109/66  Pulse: 63 74  Resp: 18 16  Temp: 97.8 F (36.6 C) (!) 97.4 F (36.3 C)  SpO2: 100% 99%    Dorsiflexion/Plantar flexion intact Incision: dressing C/D/I No cellulitis present Compartment soft  LABS Recent Labs    01/31/19 0516  HGB 11.4*  HCT 36.0*  WBC 9.8  PLT 144*    Recent Labs    01/31/19 0516  NA 138  K 4.1  BUN 12  CREATININE 0.78  GLUCOSE 121*     Assessment/Plan: 1 Day Post-Op Procedure(s) (LRB): TOTAL KNEE REVISION (Left)  Foley cath d/c'ed Advance diet Up with therapy D/C IV fluids Discharge home Follow up in 2 weeks at [facility] ([place]). Follow up with OLIN,Teruko Joswick D in 2 weeks.  Contact information:  [facility] [address], [place] [phone]    Overweight (BMI 25-29.9) Estimated body mass index is 25.71 kg/m as calculated from the following:   Height as of this encounter: 5\' 10"  (1.778 m).   Weight as of this encounter: 81.3 kg. Patient also counseled that weight may inhibit the healing process Patient counseled that losing weight will help with future health issues      [place] P[place]. [place] Alvelo   PAC  01/31/2019, 8:03 AM

## 2019-01-31 NOTE — Plan of Care (Signed)
Patient discharged home in stable condition 

## 2019-01-31 NOTE — Care Management Note (Signed)
Case Management Note  Patient Details  Name: Corrie Brannen MRN: [id number] Date of Birth: [birth year]  Subjective/Objective:                  discharge  Action/Plan: dme-has at home hhc-has chosen [facility] for home P>T.  Expected Discharge Date:  01/31/19               Expected Discharge Plan:  [place]  In-House Referral:     Discharge planning Services     Post Acute Care Choice:  Home Health Choice offered to:  Patient  DME Arranged:    DME Agency:     HH Arranged:  PT HH Agency:  [facility]  Status of Service:  Completed, signed off  If discussed at [place] of Stay Meetings, dates discussed:    Additional Comments:  Leeroy Cha, RN 01/31/2019, 10:38 AM

## 2019-01-31 NOTE — Anesthesia Postprocedure Evaluation (Signed)
Anesthesia Post Note  Patient: [name]  Procedure(s) Performed: TOTAL KNEE REVISION (Left Knee)     Patient location during evaluation: Nursing Unit Anesthesia Type: Spinal Level of consciousness: oriented and awake and alert Pain management: pain level controlled Vital Signs Assessment: post-procedure vital signs reviewed and stable Respiratory status: spontaneous breathing and respiratory function stable Cardiovascular status: blood pressure returned to baseline and stable Postop Assessment: no headache, no backache, no apparent nausea or vomiting and patient able to bend at knees Anesthetic complications: no    Last Vitals:  Vitals:   01/31/19 0634 01/31/19 0905  BP: 109/66 111/65  Pulse: 74 80  Resp: 16 15  Temp: (!) 36.3 C 36.5 C  SpO2: 99% 97%    Last Pain:  Vitals:   01/31/19 0905  TempSrc: Oral  PainSc:                  Barnet Glasgow

## 2019-01-31 NOTE — Op Note (Signed)
[name], [name] MEDICAL RECORD YB:[id number] ACCOUNT [id number] DATE OF BIRTH:May 23, 1946 FACILITY: [facility] LOCATION: [facility] PHYSICIAN:Ikram Riebe D. 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  PROCEDURE:  Revision left total knee arthroplasty.  COMPONENTS USED:  DePuy revision Sigma knee system with a size 5 posterior stabilized femur with a +2 adapter 5 degree bolt and a by 13 x 30 cemented stem, 4 mm posterior lateral augment and 8 mm posterior medial augment, a size 4 MBT revision tray, 29  cemented sleeve 5 mm medial and lateral augments, a 13 x 30 cemented stem.  A 10 mm posterior stabilized insert.  A 41 patellar button.  SURGEON:  [name], MD  ASSISTANT:  Griffith Citron, PA-C.  Note that Ms. [name] was present for the entirety of the case from preoperative positioning, perioperative management of the operative extremity, general facilitation case and primary wound closure.  ANESTHESIA:  Regional plus spinal.  DRAINS:  None.  SPECIMENS:  None.  COMPLICATIONS:  None apparent.  ESTIMATED BLOOD LOSS:  Less than 200 mL.  TOURNIQUET:  Utilized for 73 minutes at 250 mmHg.  INDICATIONS:  The patient is a 73 year old male with history of left total knee arthroplasty performed in [place], [place].  He has had problems with pain and stiffness and had subsequent scar debridement procedure.  He has had persistent  problems with regards to his motion that affects his quality of life.  I had lengthy discussions in the office regarding the pros and cons of proceeding with revision knee surgery for a stiff knee.  The risks of infection, DVT component failure, the  risks of persistent pain and stiffness were reviewed and discussed.  Given these risks, he did wish  to proceed with the procedure with hopes to improve motion and pain.  DESCRIPTION OF PROCEDURE:  The patient was brought to the operative theater.  Once adequate anesthesia, preoperative antibiotics, Ancef administered as well as tranexamic acid and Decadron, he was positioned supine with a left thigh tourniquet.  The [facility] leg holder was utilized.  The left lower extremity was prepped and draped in sterile fashion.  Timeout was performed identifying the patient, the planned procedure and extremity.  Leg was exsanguinated, tourniquet elevated to 250 mmHg.  His old  incision was excised.  Soft tissue planes were created.  A median arthrotomy was then made encountering bloody fluid in the joint.  No signs of purulence.  At this point, I exposed the knee by performing a synovectomy and scar debridement in the medial,  lateral and suprapatellar aspect of the joint.  I performed a proximal medial peel off the tibia and then did similar over the lateral tubercle to expose the joint.  No quadriceps extension was performed.  I was able to subluxate the patella laterally.   I used an osteotome and removed the polyethylene insert.  I then used a thin ACL saw and undermined the bone cement interface of the tibia and the femur.  I used a Crego to subluxate the tibia anteriorly.  This allowed for further exposure.  I then  removed the femoral component using osteotomes without bone loss.  I then was able to subluxate the tibia anteriorly and was able to use an osteotome to remove the tibial component with no bone loss.  Once this was done,  I used an extramedullary guide  positioned over the proximal tibia.  It was positioned at 2 degrees of posterior slope.  I then using the oscillating saw made on cut on the proximal tibia underneath the cement interface.  Once this was done, I removed remaining cement on the proximal  tibia.  We confirmed the cut was perpendicular in the coronal plane.  I then hand reamed the femur  and the tibia up to 15 mm for a 13 cemented stem.  Once this was done, we directed attention back to the tibia.  The final preparation of the tibia was  carried out with a size 4 tibial tray positioned over the medial third of the tubercle.  I pinned the tower into place and drilled for an MBT revision tray.  I then broached for a 29 cemented sleeve.  A trial size 4 MBT revision tray with a 29 cemented  sleeve trial was then impacted with the orientation through the medial third of the tubercle.  With this in place and knowing it was perpendicular set, we now attended to the femur.  Femoral preparation was per routine with an intramedullary rod  position.  The distal femoral cut was revisited removing about a millimeter of bone medially and laterally.  I then sized the femur initially with a size 4.  The size 4 cutting block was then positioned over the intramedullary rod with a +2 adapter.   Anterior, posterior chamfer cuts were all revisited.  During this time, we found that I was going to need a 4 mm augment on the posterior medial aspect of the joint, none laterally.  No distal augments.  The final box cut was made off the lateral aspect  of the distal femur using the intramedullary guide.  At this point, I did a trial reduction with a size 4 femur and the previously placed tibial tray in place.  With a 15 mm insert, there was noted to be some asymmetry and the flexion and extension gap.   The knee came to full extension; however, flexion was loose.  Given these findings, I elected to upsize the femur to a size 5, changing the augments to an 8 mm augment medial and a 4 mm posterior lateral augment.  This would act to effectively reduce  the flexion gap space.  With the components in place, I now attended to the patella.  Preoperatively, I noted that his patella was thick and had felt that this could have been a potential source of loss of flexion.  The precut measurement of the patella  was noted to be  31 mm.  I removed the patella button and then resected down to 14 mm and used a 41 patellar button getting height back to approximately 24-25 mm.  Lug holes were drilled.  I performed a lateral facetectomy removing a little bit of lateral  bone and also performed a lateral release.  Further soft tissues and lateral gutter were debrided.  At this point, we removed all the trial components.  I drilled sclerotic bone to allow for cement interdigitation.  I irrigated both canals with a canal  brush irrigator.  We measured and selected cement restrictors from the distal femur and proximal tibia.  All the final components were opened and cement was mixed.  On the back table, the final components were configured under my direct supervision as  well as with me directly.  The final components were then cemented into place as noted.  The  knee was held in extension with a 10 mm insert given the 5 mm augments on the proximal tibia.  Once the cement cured, excessive cement was removed throughout the  knee.  We again found with a trial 10 mm insert, the knee came to full extension with good flexion with the patella tracking through the trochlea without application of pressure.  Given these findings, the final 5 mm insert to match the 5 femur was  opened and placed into the knee.  The knee was reirrigated.  A total of 4 liters was utilized during the case.  The extensor mechanism was reapproximated using #1 Vicryl sutures in interrupted fashion as well as a #1 barbed Stratafix suture.  The  remainder of the wound was closed with 2-0 Vicryl and a running Monocryl stitch.  The knee was then cleaned, dried and dressed sterilely using surgical glue and Aquacel dressing.  The knee was wrapped in Ace wrap.  The patient was then brought to the  recovery room in stable condition, tolerating the procedure well.  Findings were reviewed with family.  He will be admitted to the hospital overnight for physical therapy with potential  discharge the next day or the day after.  We will stress extension  and flexion activities to maximize his overall recovery in hopes of gaining improvement from his preoperative state.  TN/NUANCE  D:01/30/2019 T:01/31/2019 JOB:[id number]

## 2019-01-31 NOTE — Evaluation (Signed)
Physical Therapy One Time Evaluation Patient Details Name: [name] MRN: [id number] DOB: [birth year] Today's Date: 01/31/2019   History of Present Illness  Pt is a 73 year old male s/p Revision left total knee arthroplasty on 01/30/2019  Clinical Impression  Patient evaluated by Physical Therapy with no further acute PT needs identified. All education has been completed and the patient has no further questions. Pt ambulated in hallway, practiced safe stair technique and performed LE exercises.  Pt reports his significant other will be assisting him home.  Pt provided with HEP handout.  Pt plans to f/u with either HHPT or OPPT. PT is signing off. Thank you for this referral.     Follow Up Recommendations Home health PT;Follow surgeon's recommendation for DC plan and follow-up therapies    Equipment Recommendations  Rolling walker with 5" wheels    Recommendations for Other Services       Precautions / Restrictions Precautions Precautions: Knee Restrictions Weight Bearing Restrictions: No Other Position/Activity Restrictions: WBAT      Mobility  Bed Mobility Overal bed mobility: Needs Assistance Bed Mobility: Supine to Sit     Supine to sit: Supervision        Transfers Overall transfer level: Needs assistance Equipment used: Rolling walker (2 wheeled) Transfers: Sit to/from Stand Sit to Stand: Min guard;Supervision         General transfer comment: verbal cues for UE and LE positioning  Ambulation/Gait Ambulation/Gait assistance: Min guard;Supervision Gait Distance (Feet): 240 Feet Assistive device: Rolling walker (2 wheeled) Gait Pattern/deviations: Step-to pattern;Step-through pattern;Antalgic;Decreased stance time - left     General Gait Details: verbal cues for sequence, RW positioning, posture  Stairs Stairs: Yes Stairs assistance: Min guard Stair Management: Step to pattern;Forwards;With cane;No rails Number of Stairs: 3 General stair comments:  verbal cues for sequence and safety, performed twice  Wheelchair Mobility    Modified Rankin (Stroke Patients Only)       Balance                                             Pertinent Vitals/Pain Pain Assessment: 0-10 Pain Score: 4  Pain Location: L knee Pain Descriptors / Indicators: Aching;Sore Pain Intervention(s): Limited activity within patient's tolerance;Monitored during session;Repositioned;Ice applied    Home Living Family/patient expects to be discharged to:: Private residence Living Arrangements: Spouse/significant other   Type of Home: House Home Access: Stairs to enter Entrance Stairs-Rails: None Entrance Stairs-Number of Steps: 3 Home Layout: Able to live on main level with bedroom/bathroom Home Equipment: Otis - single point      Prior Function Level of Independence: Independent               Hand Dominance        Extremity/Trunk Assessment        Lower Extremity Assessment Lower Extremity Assessment: LLE deficits/detail LLE Deficits / Details: able to perform SLR, AAROM L knee flexion approx 90* after heel slides in sitting       Communication   Communication: No difficulties  Cognition Arousal/Alertness: Awake/alert Behavior During Therapy: WFL for tasks assessed/performed Overall Cognitive Status: Within Functional Limits for tasks assessed                                        General  Comments      Exercises Total Joint Exercises Ankle Circles/Pumps: AROM;10 reps;Both Quad Sets: AROM;10 reps;Left Short Arc Quad: AROM;10 reps;Left Heel Slides: AAROM;10 reps;Left;Seated Hip ABduction/ADduction: AROM;10 reps;Left Straight Leg Raises: AROM;10 reps;Left   Assessment/Plan    PT Assessment All further PT needs can be met in the next venue of care  PT Problem List Decreased range of motion;Decreased strength;Pain;Decreased mobility       PT Treatment Interventions      PT Goals (Current  goals can be found in the Care Plan section)  Acute Rehab PT Goals Patient Stated Goal: return to golfing, also would like to be able to bike PT Goal Formulation: All assessment and education complete, DC therapy    Frequency     Barriers to discharge        Co-evaluation               AM-PAC PT "6 Clicks" Mobility  Outcome Measure Help needed turning from your back to your side while in a flat bed without using bedrails?: None Help needed moving from lying on your back to sitting on the side of a flat bed without using bedrails?: A Little Help needed moving to and from a bed to a chair (including a wheelchair)?: A Little Help needed standing up from a chair using your arms (e.g., wheelchair or bedside chair)?: A Little Help needed to walk in hospital room?: A Little Help needed climbing 3-5 steps with a railing? : A Little 6 Click Score: 19    End of Session Equipment Utilized During Treatment: Gait belt Activity Tolerance: Patient tolerated treatment well Patient left: in chair;with call bell/phone within reach Nurse Communication: Mobility status PT Visit Diagnosis: Other abnormalities of gait and mobility (R26.89)    Time: 1010-1033 PT Time Calculation (min) (ACUTE ONLY): 23 min   Charges:   PT Evaluation $PT Eval Low Complexity: 1 Low PT Treatments $Therapeutic Exercise: 8-22 mins   Carmelia Bake, PT, DPT Acute Rehabilitation Services Office: [phone] Pager: [phone]   Trena Platt 01/31/2019, 12:28 PM

## 2019-02-01 ENCOUNTER — Encounter (HOSPITAL_COMMUNITY): Payer: Self-pay | Admitting: Orthopedic Surgery

## 2019-02-01 NOTE — Discharge Summary (Signed)
Physician Discharge Summary  Patient ID: [name] MRN: [id number] DOB/AGE: [birth year] 73 y.o.  Admit date: 01/30/2019 Discharge date: 01/31/2019   Procedures:  Procedure(s) (LRB): TOTAL KNEE REVISION (Left)  Attending Physician:  Dr. [name]   Admission Diagnoses:   Left knee pain and stiffness s/p TKA  Discharge Diagnoses:  Principal Problem:   S/P left TK revision  Past Medical History:  Diagnosis Date  . Acquired right foot drop    numbness   . Arthritis   . Cancer ([place])    skin   . Elevated cholesterol   . History of kidney stones   . PONV (postoperative nausea and vomiting)   . Sleep apnea    Does not use CPAP    HPI:    [name], 73 y.o. 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 The indications for the revision of the total knee arthroplasty are stiffness with pain. Onset of symptoms was gradual starting 1+ years ago with gradually worsening course since that time.  Prior procedures on the left knee include arthroplasty. Patient currently rates pain in the left knee(s) at 5 out of 10 which is primarily after long periods of rest. There is night pain, pain that interferes with activities of daily living, pain with passive range of motion and joint swelling.  Patient has evidence of previous left TKA by imaging studies. This condition presents safety issues increasing the risk of falls.  There is no current active infection.  Risks, benefits and expectations were discussed with the patient.  Risks including but not limited to the risk of anesthesia, blood clots, nerve damage, blood vessel damage, failure of the prosthesis, infection and up to and including death.  Patient understand the risks,  ben  PCP: Derrill Center., MD   Discharged Condition: good  Hospital Course:  Patient underwent the above stated procedure on 01/30/2019. Patient tolerated the procedure well and brought to the recovery room in good condition and subsequently to the floor.  POD #1 BP: 109/66 ; Pulse: 74 ; Temp: 97.4 F (36.3 C) ; Resp: 16 Patient reports pain as mild, pain controlled.  No events throughout the night.  Dr. Alvan Dame discussed the procedure and findings.  Also discussed the importance of PT post-op and obtaining ROM and preventing scar tissue.  Ready to be discharged home.  Dorsiflexion/plantar flexion intact, incision: dressing C/D/I, no cellulitis present and compartment soft.   LABS  Basename    HGB     11.4  HCT     36.0    Discharge Exam: General appearance: alert, cooperative and no distress Extremities: Homans sign is negative, no sign of DVT, no edema, redness or tenderness in the calves or thighs and no ulcers, gangrene or trophic changes  Disposition:  Home with follow up in 2 weeks   Follow-up Information    [name], MD. Schedule an appointment as soon as possible for a visit in 2 weeks.   Specialty:  Orthopedic Surgery Contact information: [address] [place] [address] [phone]           Discharge Instructions    Call MD / Call 911   Complete by:  As directed    If you experience chest pain or shortness of breath, CALL  911 and be transported to the hospital emergency room.  If you develope a fever above 101 F, pus (white drainage) or increased drainage or redness at the wound, or calf pain, call your surgeon's office.   Change dressing   Complete by:  As directed    Maintain surgical dressing until follow up in the clinic. If the edges start to pull up, may reinforce with tape. If the dressing is no longer working, may remove and cover with gauze and tape, but must keep the area dry and clean.  Call with any questions or concerns.    Constipation Prevention   Complete by:  As directed    Drink plenty of fluids.  Prune juice may be helpful.  You may use a stool softener, such as Colace (over the counter) 100 mg twice a day.  Use MiraLax (over the counter) for constipation as needed.   Diet - low sodium heart healthy   Complete by:  As directed    Discharge instructions   Complete by:  As directed    Maintain surgical dressing until follow up in the clinic. If the edges start to pull up, may reinforce with tape. If the dressing is no longer working, may remove and cover with gauze and tape, but must keep the area dry and clean.  Follow up in 2 weeks at [facility]. Call with any questions or concerns.   Increase activity slowly as tolerated   Complete by:  As directed    Weight bearing as tolerated with assist device (walker, cane, etc) as directed, use it as long as suggested by your surgeon or therapist, typically at least 4-6 weeks.   TED hose   Complete by:  As directed    Use stockings (TED hose) for 2 weeks on both leg(s).  You may remove them at night for sleeping.      Allergies as of 01/31/2019      Reactions   Corticosteroids Other (See Comments)   Unknown   Prednisone Other (See Comments)   Hiccups      Medication List    STOP taking these medications   aspirin EC 81 MG tablet Replaced by:  aspirin 81 MG chewable tablet     TAKE these medications   acyclovir 400 MG tablet Commonly known as:  ZOVIRAX Take 400 mg by mouth 2 (two) times daily.   aspirin 81 MG chewable tablet Commonly known as:  ASPIRIN CHILDRENS Chew 1 tablet (81 mg total) by mouth 2 (two) times daily for 30 days. Take for 4 weeks, then resume regular dose. Replaces:  aspirin EC 81 MG tablet   docusate sodium 100 MG capsule Commonly known as:  COLACE Take 1 capsule (100 mg total) by mouth 2 (two) times daily.   ferrous sulfate 325 (65 FE) MG tablet Commonly known as:  FERROUSUL Take 1 tablet (325 mg total) by mouth  3 (three) times daily with meals. What changed:    medication strength  how much to take  when to take this   HYDROcodone-acetaminophen 7.5-325 MG tablet Commonly known as:  NORCO Take 1-2 tablets by mouth every 4 (four) hours as needed for moderate pain.   loratadine 10 MG tablet Commonly known as:  CLARITIN Take 10 mg by mouth daily.   methocarbamol 500 MG tablet Commonly known as:  ROBAXIN Take 1 tablet (500 mg total) by mouth every 6 (six) hours as needed for muscle spasms.   MULTIVITAMIN PO Take 1 tablet by mouth  daily.   polyethylene glycol packet Commonly known as:  MIRALAX / GLYCOLAX Take 17 g by mouth 2 (two) times daily.   rosuvastatin 20 MG tablet Commonly known as:  CRESTOR Take 20 mg by mouth daily.   sildenafil 20 MG tablet Commonly known as:  REVATIO Take 20 mg by mouth daily as needed (for ED).            Discharge Care Instructions  (From admission, onward)         Start     Ordered   01/31/19 0000  Change dressing    Comments:  Maintain surgical dressing until follow up in the clinic. If the edges start to pull up, may reinforce with tape. If the dressing is no longer working, may remove and cover with gauze and tape, but must keep the area dry and clean.  Call with any questions or concerns.   01/31/19 [id number]           Signed: [place]. Deann Mclaine   PA-C  02/01/2019, 2:10 PM

## 2019-11-28 IMAGING — NM NM BONE 3 PHASE
9 series · 19 of 19 positions shown · non-contrast
Comparison: None.

CLINICAL DATA: Chronic pain following left total knee arthroplasty.
Arthrofibrosis. Date of surgery unknown.

EXAM:
NUCLEAR MEDICINE 3-PHASE BONE SCAN
TECHNIQUE: Radionuclide angiographic images, immediate static blood pool
images, and 3-hour delayed static images were obtained of the knees
after intravenous injection of radiopharmaceutical.
RADIOPHARMACEUTICALS:  20.0 mCi Rc-LLm MDP IV

[Series 1: flow · 2.07mm/px · 6 of 48 frames shown (1 of 2)]
[frame 5/48]
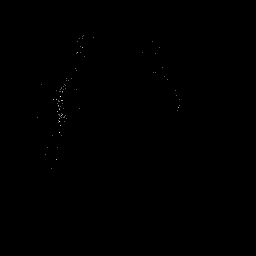
[frame 13/48]
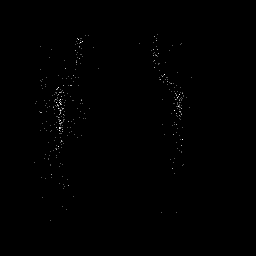
[frame 21/48  full-range]
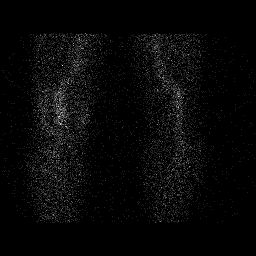
[frame 29/48  full-range]
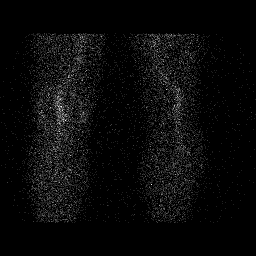
[frame 37/48  full-range]
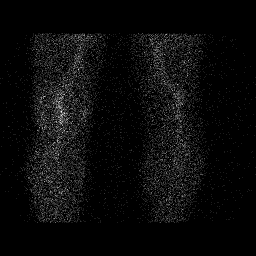
[frame 45/48  full-range]
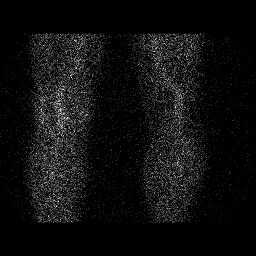

[Series 1: flow · 2.07mm/px · 6 of 48 frames shown (2 of 2)]
[frame 5/48]
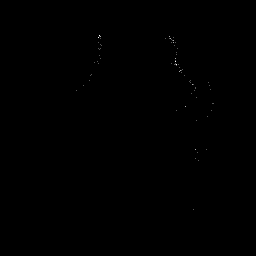
[frame 13/48]
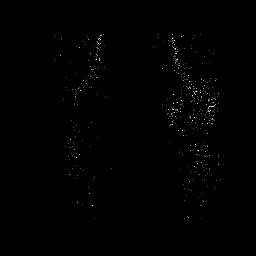
[frame 21/48  full-range]
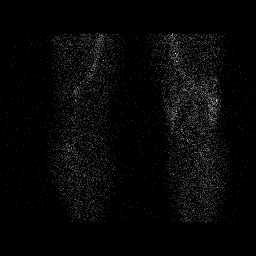
[frame 29/48  full-range]
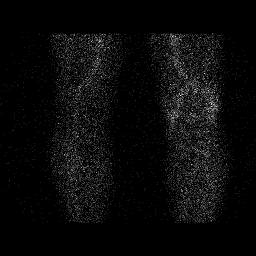
[frame 37/48  full-range]
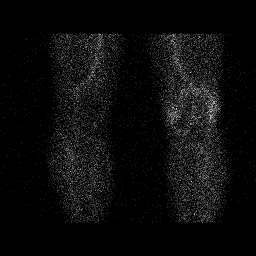
[frame 45/48  full-range]
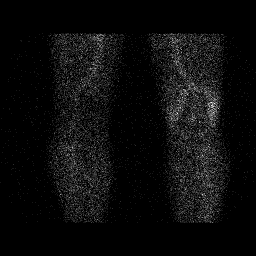

[Series 2: blood pool · 2.07mm/px · 1 of 1 slices shown (1 of 2)]
[im 1/1  full-range]
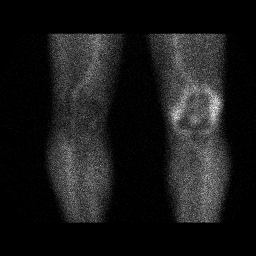

[Series 2: blood pool · 2.07mm/px · 1 of 1 slices shown (2 of 2)]
[im 1/1  full-range]
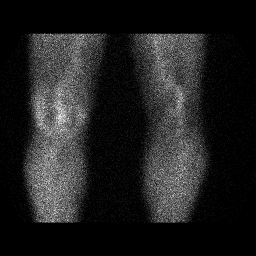

[Series 3: lat bp · 2.07mm/px · 1 of 1 slices shown (1 of 2)]
[im 1/1]
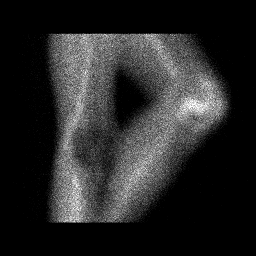

[Series 3: lat bp · 2.07mm/px · 1 of 1 slices shown (2 of 2)]
[im 1/1]
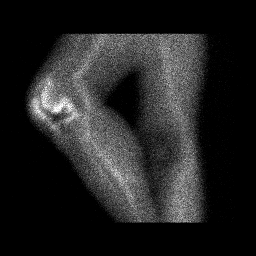

[Series 4: delay · delayed · 2.07mm/px · 1 of 1 slices shown (1 of 3)]
[im 1/1]
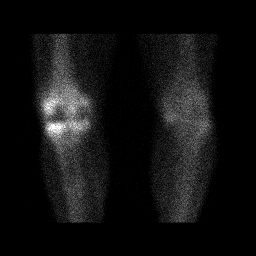

[Series 5: delay · delayed · 2.07mm/px · 1 of 1 slices shown (2 of 3)]
[im 1/1]
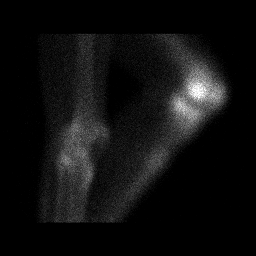

[Series 5: delay · delayed · 2.07mm/px · 1 of 1 slices shown (3 of 3)]
[im 1/1]
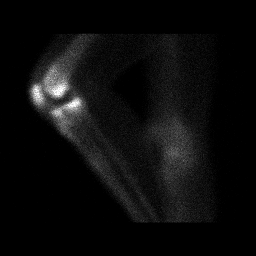

[19 of 19 positions shown; findings below may reference images not displayed]

FINDINGS: Vascular phase: Relative to the right knee, there is mildly
increased perfusion to the left knee.

Blood pool phase: There is moderate asymmetric blood pool activity
on the left, primarily surrounding the distal femur.

Delayed phase: There is moderate asymmetric delayed phase activity
surrounding the left total knee arthroplasty with similar components
in the distal femur, proximal tibia and patella. Probable mild right
knee medial compartment degenerative activity.
IMPRESSION: 1. Abnormal activity at the left knee in all 3 phases status post
knee arthroplasty. If the surgery was recent, these findings may be
normal. Presuming no recent surgery based on history, these findings
are suspicious for hardware loosening or infection. Plain film
correlation recommended.
2. Probable mild degenerative activity in the medial compartment of
the right knee.

## 2020-01-14 ENCOUNTER — Ambulatory Visit: Payer: Medicare Other | Attending: Internal Medicine

## 2020-01-14 DIAGNOSIS — Z23 Encounter for immunization: Secondary | ICD-10-CM | POA: Insufficient documentation

## 2020-01-14 NOTE — Progress Notes (Signed)
   Covid-19 Vaccination Clinic  Name:  [name]    MRN: [id number] DOB: December 11, 1945  01/14/2020  Mr. [name] was observed post Covid-19 immunization for 15 minutes without incidence. He was provided with Vaccine Information Sheet and instruction to access the V-Safe system.   Mr. [name] was instructed to call 911 with any severe reactions post vaccine: Marland Kitchen Difficulty breathing  . Swelling of your face and throat  . A fast heartbeat  . A bad rash all over your body  . Dizziness and weakness    Immunizations Administered    Name Date Dose VIS Date Route   Pfizer COVID-19 Vaccine 01/14/2020 10:10 AM 0.3 mL 11/10/2019 Intramuscular   Manufacturer: [place]   Lot: [id number]   [place]: [id number]

## 2020-02-06 ENCOUNTER — Ambulatory Visit: Payer: Medicare Other | Attending: Internal Medicine

## 2020-02-06 DIAGNOSIS — Z23 Encounter for immunization: Secondary | ICD-10-CM | POA: Insufficient documentation

## 2020-02-06 NOTE — Progress Notes (Signed)
   Covid-19 Vaccination Clinic  Name:  Saunders Heinzman    MRN: [id number] DOB: [birth year]  02/06/2020  Mr. Mohiuddin was observed post Covid-19 immunization for 15 minutes without incident. He was provided with Vaccine Information Sheet and instruction to access the V-Safe system.   Mr. Lauer was instructed to call 911 with any severe reactions post vaccine: Marland Kitchen Difficulty breathing  . Swelling of face and throat  . A fast heartbeat  . A bad rash all over body  . Dizziness and weakness   Immunizations Administered    Name Date Dose VIS Date Route   Pfizer COVID-19 Vaccine 02/06/2020 11:53 AM 0.3 mL 11/10/2019 Intramuscular   Manufacturer: [place]   Lot: [id number]   [place]: [id number]
